# Patient Record
Sex: Female | Born: 1990 | Hispanic: No | Smoking: Never smoker
Health system: Southern US, Community
[De-identification: ages and names within clinical notes are randomized; demographics above are authoritative.]

## PROBLEM LIST (undated history)

## (undated) DIAGNOSIS — R87619 Unspecified abnormal cytological findings in specimens from cervix uteri: Secondary | ICD-10-CM

## (undated) HISTORY — PX: TENDON TRANSPLANT: SHX2488

## (undated) HISTORY — DX: Unspecified abnormal cytological findings in specimens from cervix uteri: R87.619

---

## 2018-11-22 ENCOUNTER — Emergency Department: Payer: Medicaid Other

## 2018-11-22 ENCOUNTER — Encounter: Payer: Self-pay | Admitting: *Deleted

## 2018-11-22 ENCOUNTER — Emergency Department
Admission: EM | Admit: 2018-11-22 | Discharge: 2018-11-22 | Disposition: A | Discharge status: Home / Self Care | Payer: Medicaid Other | Attending: Emergency Medicine | Admitting: Emergency Medicine

## 2018-11-22 ENCOUNTER — Other Ambulatory Visit: Payer: Self-pay

## 2018-11-22 DIAGNOSIS — Z5321 Procedure and treatment not carried out due to patient leaving prior to being seen by health care provider: Secondary | ICD-10-CM | POA: Insufficient documentation

## 2018-11-22 DIAGNOSIS — R2241 Localized swelling, mass and lump, right lower limb: Secondary | ICD-10-CM | POA: Insufficient documentation

## 2018-11-22 NOTE — ED Triage Notes (Signed)
Pt to triage via wheelchair. Pt injured right ankle while playing basketball.  Swelling to ankle noted   Denies other injury   Pt alert

## 2018-11-22 NOTE — ED Notes (Signed)
Pt was called to be roomed. Pt did not answer. RN called the Pt in the lobby and outside of the ED.  

## 2018-11-22 NOTE — ED Notes (Signed)
Pt was called to be roomed. Pt did not answer. RN called the Pt in the lobby and outside of the ED.

## 2018-12-02 ENCOUNTER — Other Ambulatory Visit: Payer: Self-pay

## 2018-12-02 ENCOUNTER — Emergency Department
Admission: EM | Admit: 2018-12-02 | Discharge: 2018-12-02 | Disposition: A | Discharge status: Home / Self Care | Payer: Medicaid Other | Attending: Emergency Medicine | Admitting: Emergency Medicine

## 2018-12-02 ENCOUNTER — Encounter: Payer: Self-pay | Admitting: Emergency Medicine

## 2018-12-02 DIAGNOSIS — S96822A Laceration of other specified muscles and tendons at ankle and foot level, left foot, initial encounter: Secondary | ICD-10-CM | POA: Insufficient documentation

## 2018-12-02 DIAGNOSIS — Y92000 Kitchen of unspecified non-institutional (private) residence as  the place of occurrence of the external cause: Secondary | ICD-10-CM | POA: Diagnosis not present

## 2018-12-02 DIAGNOSIS — S91312A Laceration without foreign body, left foot, initial encounter: Secondary | ICD-10-CM | POA: Diagnosis present

## 2018-12-02 DIAGNOSIS — Y9389 Activity, other specified: Secondary | ICD-10-CM | POA: Insufficient documentation

## 2018-12-02 DIAGNOSIS — W260XXA Contact with knife, initial encounter: Secondary | ICD-10-CM | POA: Insufficient documentation

## 2018-12-02 DIAGNOSIS — Y998 Other external cause status: Secondary | ICD-10-CM | POA: Diagnosis not present

## 2018-12-02 DIAGNOSIS — S96829A Laceration of other specified muscles and tendons at ankle and foot level, unspecified foot, initial encounter: Secondary | ICD-10-CM

## 2018-12-02 DIAGNOSIS — S91319A Laceration without foreign body, unspecified foot, initial encounter: Secondary | ICD-10-CM

## 2018-12-02 MED ORDER — LIDOCAINE-EPINEPHRINE 2 %-1:100000 IJ SOLN
20.0000 mL | Freq: Once | INTRAMUSCULAR | Status: AC
  Administered 2018-12-02: 1 mL via INTRADERMAL

## 2018-12-02 NOTE — ED Triage Notes (Addendum)
Pt presents to ED with laceration to top of left foot. Pt states she dropped a knife on foot approx 30 min ago. Small laceration with approximated edges noted. Bleeding currently controlled. Dressing applied in triage.

## 2018-12-02 NOTE — ED Provider Notes (Signed)
[facility] Emergency Department Provider Note   ____________________________________________    I have reviewed the triage vital signs and the nursing notes.   HISTORY  Chief Complaint Extremity Laceration     HPI Julie Todd is a 28 y.o. female presents with a laceration to her left foot.  Patient reports a chef's knife fell off the counter into the top of her left foot.  She reports she has difficulty extending her left big toe.  Tetanus is up-to-date  History reviewed. No pertinent past medical history.  There are no active problems to display for this patient.   History reviewed. No pertinent surgical history.  Prior to Admission medications   Not on File     Allergies Patient has no known allergies.  No family history on file.  Social History Social History   Tobacco Use  . Smoking status: Never Smoker  . Smokeless tobacco: Never Used  Substance Use Topics  . Alcohol use: Yes  . Drug use: Not Currently    Review of Systems     Musculoskeletal: Left foot pain Skin: Laceration as above Neurological: No numbness, weakness of left first toe extension    ____________________________________________   PHYSICAL EXAM:  VITAL SIGNS: ED Triage Vitals [12/02/18 0124]  Enc Vitals Group     BP (!) 146/126     Pulse Rate (!) 104     Resp 20     Temp 98.2 F (36.8 C)     Temp Source Oral     SpO2 98 %     Weight 81.6 kg (180 lb)     Height 1.651 m (5\' 5" )     Head Circumference      Peak Flow      Pain Score 6     Pain Loc      Pain Edu?      Excl. in GC?      Constitutional: Alert and oriented.    Musculoskeletal: Left foot: Approximately 2 cm linear laceration to the left foot on the medial aspect dorsally.  Patient appears unable to extend her first toe which raises suspicion for tendon laceration, tendon is not visible on exam.  No foreign bodies Neurologic: No numbness Skin:  Skin is warm,  dry   ____________________________________________   LABS (all labs ordered are listed, but only abnormal results are displayed)  Labs Reviewed - No data to display ____________________________________________  EKG   ____________________________________________  RADIOLOGY   ____________________________________________   PROCEDURES  Procedure(s) performed: yes  .Marland KitchenLaceration Repair Date/Time: 12/02/2018 2:36 AM Performed by: Jene Every, MD Authorized by: Jene Every, MD   Consent:    Consent obtained:  Verbal   Consent given by:  Patient   Risks discussed:  Infection, pain, retained foreign body, poor cosmetic result and poor wound healing Anesthesia (see MAR for exact dosages):    Anesthesia method:  Local infiltration   Local anesthetic:  Lidocaine 1% WITH epi Laceration details:    Location:  Foot   Foot location:  Top of L foot   Length (cm):  2 Repair type:    Repair type:  Simple Pre-procedure details:    Preparation:  Patient was prepped and draped in usual sterile fashion Exploration:    Hemostasis achieved with:  Direct pressure   Wound exploration: entire depth of wound probed and visualized     Wound extent: tendon damage     Tendon repair plan:  Refer for evaluation   Contaminated: no  Treatment:    Area cleansed with:  Betadine   Amount of cleaning:  Standard   Irrigation solution:  Sterile saline   Visualized foreign bodies/material removed: no   Skin repair:    Repair method:  Sutures   Suture size:  4-0   Suture material:  Nylon   Suture technique:  Simple interrupted Approximation:    Approximation:  Close Post-procedure details:    Dressing:  Adhesive bandage   Patient tolerance of procedure:  Tolerated well, no immediate complications     Critical Care performed: No ____________________________________________   INITIAL IMPRESSION / ASSESSMENT AND PLAN / ED COURSE  Pertinent labs & imaging results that were available  during my care of the patient were reviewed by me and considered in my medical decision making (see chart for details).  Patient with laceration to the left foot, suspicious for tendon laceration she appears to have difficulty extending her great toe although this may be related to pain as well.  Wound sutured, will defer to podiatry for further evaluation of possible tendon injury.   ____________________________________________   FINAL CLINICAL IMPRESSION(S) / ED DIAGNOSES  Final diagnoses:  Laceration of foot involving extensor tendon      NEW MEDICATIONS STARTED DURING THIS VISIT:  New Prescriptions   No medications on file     Note:  This document was prepared using Dragon voice recognition software and may include unintentional dictation errors.   Jene Every, MD 12/02/18 [id number]

## 2018-12-06 ENCOUNTER — Other Ambulatory Visit: Payer: Self-pay | Admitting: Podiatry

## 2018-12-06 ENCOUNTER — Ambulatory Visit: Payer: Medicaid Other | Admitting: Podiatry

## 2018-12-06 ENCOUNTER — Encounter: Payer: Self-pay | Admitting: Podiatry

## 2018-12-06 ENCOUNTER — Ambulatory Visit (INDEPENDENT_AMBULATORY_CARE_PROVIDER_SITE_OTHER): Payer: Medicaid Other

## 2018-12-06 DIAGNOSIS — S91312A Laceration without foreign body, left foot, initial encounter: Secondary | ICD-10-CM

## 2018-12-06 DIAGNOSIS — S96922A Laceration of unspecified muscle and tendon at ankle and foot level, left foot, initial encounter: Secondary | ICD-10-CM | POA: Diagnosis not present

## 2018-12-06 DIAGNOSIS — S99922A Unspecified injury of left foot, initial encounter: Secondary | ICD-10-CM

## 2018-12-06 NOTE — Patient Instructions (Signed)
Pre-Operative Instructions  Congratulations, you have decided to take an important step towards improving your quality of life.  You can be assured that the doctors and staff at [facility] will be with you every step of the way.  Here are some important things you should know:  1. Plan to be at the surgery center/hospital at least 1 (one) hour prior to your scheduled time, unless otherwise directed by the surgical center/hospital staff.  You must have a responsible adult accompany you, remain during the surgery and drive you home.  Make sure you have directions to the surgical center/hospital to ensure you arrive on time. 2. If you are having surgery at [facility] or [facility] hospitals, you will need a copy of your medical history and physical form from your family physician within one month prior to the date of surgery. We will give you a form for your primary physician to complete.  3. We make every effort to accommodate the date you request for surgery.  However, there are times where surgery dates or times have to be moved.  We will contact you as soon as possible if a change in schedule is required.   4. No aspirin/ibuprofen for one week before surgery.  If you are on aspirin, any non-steroidal anti-inflammatory medications (Mobic, Aleve, Ibuprofen) should not be taken seven (7) days prior to your surgery.  You make take Tylenol for pain prior to surgery.  5. Medications - If you are taking daily heart and blood pressure medications, seizure, reflux, allergy, asthma, anxiety, pain or diabetes medications, make sure you notify the surgery center/hospital before the day of surgery so they can tell you which medications you should take or avoid the day of surgery. 6. No food or drink after midnight the night before surgery unless directed otherwise by surgical center/hospital staff. 7. No alcoholic beverages 24-hours prior to surgery.  No smoking 24-hours prior or 24-hours after  surgery. 8. Wear loose pants or shorts. They should be loose enough to fit over bandages, boots, and casts. 9. Don't wear slip-on shoes. Sneakers are preferred. 10. Bring your boot with you to the surgery center/hospital.  Also bring crutches or a walker if your physician has prescribed it for you.  If you do not have this equipment, it will be provided for you after surgery. 11. If you have not been contacted by the surgery center/hospital by the day before your surgery, call to confirm the date and time of your surgery. 12. Leave-time from work may vary depending on the type of surgery you have.  Appropriate arrangements should be made prior to surgery with your employer. 13. Prescriptions will be provided immediately following surgery by your doctor.  Fill these as soon as possible after surgery and take the medication as directed. Pain medications will not be refilled on weekends and must be approved by the doctor. 14. Remove nail polish on the operative foot and avoid getting pedicures prior to surgery. 15. Wash the night before surgery.  The night before surgery wash the foot and leg well with water and the antibacterial soap provided. Be sure to pay special attention to beneath the toenails and in between the toes.  Wash for at least three (3) minutes. Rinse thoroughly with water and dry well with a towel.  Perform this wash unless told not to do so by your physician.  Enclosed: 1 Ice pack (please put in freezer the night before surgery)   1 Hibiclens skin cleaner     Pre-op instructions  If you have any questions regarding the instructions, please do not hesitate to call our office.  [place]: [address], [place], [place] [address] -- [phone]  [place]: [address] [place], [place] [address] -- [phone]  [place]: [address]  [place], [place] [address] -- [phone]  [place]: [address], [address], [place], [place] [address] -- [phone]  Website:  [email or web address] 

## 2018-12-08 ENCOUNTER — Telehealth: Payer: Self-pay | Admitting: *Deleted

## 2018-12-08 NOTE — Telephone Encounter (Signed)
"  I'm calling to set up an appointment.  Call me at your convenience."

## 2018-12-20 NOTE — Progress Notes (Signed)
   HPI: 28 year old female presents the office today for evaluation of a laceration to the left dorsal foot.  She says that approximately 1 week ago she dropped a knife on top of her foot.  She was seen at [facility] hospital and stitches were performed the superficial skin.  She does not have much pain however she cannot dorsiflex her left hallux.  She has been taking Aleve as needed pain and swelling.  No past medical history on file.   Physical Exam: General: The patient is alert and oriented x3 in no acute distress.  Dermatology: Skin is warm, dry and supple bilateral lower extremities. Negative for open lesions or macerations.  Vascular: Palpable pedal pulses bilaterally. No edema or erythema noted. Capillary refill within normal limits.  Neurological: Epicritic and protective threshold grossly intact bilaterally.   Musculoskeletal Exam: Loss of dorsiflexion noted to the left hallux consistent with laceration of the EHL tendon range of motion within normal limits to all pedal and ankle joints bilateral. Muscle strength 5/5 in all groups bilateral.   Radiographic Exam:  Normal osseous mineralization. Joint spaces preserved. No fracture/dislocation/boney destruction.    Assessment: 1.  Lacerated EHL tendon left   Plan of Care:  1. Patient evaluated. X-Rays reviewed.  2.  Today explained the patient that she needs to have surgical repair of the extensor tendon left in order to restore her function.  All possible complications and details the procedure were explained.  No guarantees were expressed or implied. 3.  Authorization for surgery initiated today.  Surgery will consist of primary repair extensor hallucis longus tendon left 4.  Prior to surgery I would like to have an MRI to better visualize the opposing ends of the lacerated tendon.  This would help in surgical planning to determine how far retracted the tendons are 5.  The patient is currently wearing an immobilization cam boot and  using crutches.  Continue as needed 6.  Return to clinic 1 week postop  *Girlfriend is Revonda Standard.  Her 63-year-old boy is IT consultant.  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 Felecia Shelling, DPM    [address] [place], [place] [address]                Office [phone]  Fax [phone]

## 2019-01-03 NOTE — Telephone Encounter (Signed)
I attempted to return her call.  I left her messages to give me a call back. 

## 2019-02-09 NOTE — Telephone Encounter (Signed)
I am calling you in regards to setting up a surgery date.  Would you like to schedule a date?  "Yes, I would."  Dr. Logan Bores does surgeries on Thursdays.  Do you have a date you would like to do it?  "Any date is fine."  He can do it on Feb 23, 2019.  "That date will be fine."  I'll get it scheduled.  Someone from the surgical center will give you a call a day or two prior to your surgery date and they will give you your arrival time.  "Okay, thank you."  I scheduled the surgery via the surgical center's [employer] Portal.

## 2019-02-10 NOTE — Telephone Encounter (Signed)
I spoke with patient and informed her that I will get authorization with Medicaid for MRI again and schedule her MRI appt.  I informed her that I will call her when this is done.  She verbalized understanding

## 2019-02-10 NOTE — Telephone Encounter (Signed)
Julie Todd and Julie Todd, this patient was seen in February for a tendon laceration. I ordered an MRI. It would be nice to have it done prior to surgery so I can visualize how far the tendon has retracted. Could someone please follow up with that? Thanks

## 2019-02-10 NOTE — Telephone Encounter (Signed)
Unable to leave a message mailbox is full. 

## 2019-02-13 ENCOUNTER — Other Ambulatory Visit: Payer: Self-pay

## 2019-02-13 DIAGNOSIS — S96922A Laceration of unspecified muscle and tendon at ankle and foot level, left foot, initial encounter: Principal | ICD-10-CM

## 2019-02-13 DIAGNOSIS — S91312A Laceration without foreign body, left foot, initial encounter: Secondary | ICD-10-CM

## 2019-02-13 NOTE — Progress Notes (Signed)
MRI has been re-approved from 12/29/2018 to 06/27/2019 Auth# [id number]

## 2019-02-14 ENCOUNTER — Telehealth: Payer: Self-pay | Admitting: *Deleted

## 2019-02-14 ENCOUNTER — Ambulatory Visit
Admission: RE | Admit: 2019-02-14 | Discharge: 2019-02-14 | Disposition: A | Discharge status: Home / Self Care | Payer: Medicaid Other | Source: Ambulatory Visit | Attending: Podiatry | Admitting: Podiatry

## 2019-02-14 ENCOUNTER — Other Ambulatory Visit: Payer: Self-pay

## 2019-02-14 DIAGNOSIS — S91312A Laceration without foreign body, left foot, initial encounter: Secondary | ICD-10-CM | POA: Diagnosis not present

## 2019-02-14 DIAGNOSIS — S96922A Laceration of unspecified muscle and tendon at ankle and foot level, left foot, initial encounter: Secondary | ICD-10-CM | POA: Insufficient documentation

## 2019-02-14 NOTE — Telephone Encounter (Signed)
[facility] states pt needs orders put in for the MRI scheduled today.

## 2019-02-23 ENCOUNTER — Other Ambulatory Visit: Payer: Self-pay | Admitting: Podiatry

## 2019-02-23 DIAGNOSIS — S96922D Laceration of unspecified muscle and tendon at ankle and foot level, left foot, subsequent encounter: Secondary | ICD-10-CM

## 2019-02-23 DIAGNOSIS — S96112A Strain of muscle and tendon of long extensor muscle of toe at ankle and foot level, left foot, initial encounter: Secondary | ICD-10-CM | POA: Diagnosis not present

## 2019-02-23 MED ORDER — OXYCODONE-ACETAMINOPHEN 5-325 MG PO TABS: 1.0000 | ORAL_TABLET | Freq: Four times a day (QID) | ORAL | 0 refills | Status: DC | PRN

## 2019-02-23 NOTE — Progress Notes (Signed)
.  postop

## 2019-02-27 ENCOUNTER — Encounter: Payer: Self-pay | Admitting: Podiatry

## 2019-02-27 ENCOUNTER — Telehealth: Payer: Self-pay

## 2019-02-27 NOTE — Telephone Encounter (Signed)
Called pt post surgery; "doing good, only pain is at night when foot is elevated in bed; taking half a percocet every couple of hours seem to help; no other concerns"; informed pt the importance of elevation and staying off foot as much as possible; informed pt of next post op visit 03/03/2019 but to call if any questions or concerns if any before then

## 2019-03-03 ENCOUNTER — Other Ambulatory Visit: Payer: Self-pay

## 2019-03-03 ENCOUNTER — Ambulatory Visit (INDEPENDENT_AMBULATORY_CARE_PROVIDER_SITE_OTHER): Payer: Self-pay | Admitting: Podiatry

## 2019-03-03 VITALS — Temp 97.4°F

## 2019-03-03 DIAGNOSIS — S96922D Laceration of unspecified muscle and tendon at ankle and foot level, left foot, subsequent encounter: Secondary | ICD-10-CM

## 2019-03-03 DIAGNOSIS — S96822D Laceration of other specified muscles and tendons at ankle and foot level, left foot, subsequent encounter: Secondary | ICD-10-CM

## 2019-03-03 DIAGNOSIS — Z9889 Other specified postprocedural states: Secondary | ICD-10-CM

## 2019-03-06 NOTE — Progress Notes (Signed)
   Subjective:  Patient presents today status post EHL tendon repair left. DOS: 02/23/2019. She states she is doing well overall. She reports some pain but states it is tolerable with the pain medications. There are no modifying factors noted. She denies nausea, vomiting, fever, chills, SOB or CP. Patient is here for further evaluation and treatment.    No past medical history on file.    Objective/Physical Exam Neurovascular status intact.  Skin incisions appear to be well coapted with sutures and staples intact. No sign of infectious process noted. No dehiscence. No active bleeding noted. Moderate edema noted to the surgical extremity.  Assessment: 1. s/p EHL tendon repair left. DOS: 02/23/2019   Plan of Care:  1. Patient was evaluated. 2. Dressing changed. Keep clean, dry and intact for one week.  3. Continue nonweightbearing in CAM boot with crutches.  4. Return to clinic in one week for suture removal.    Felecia Shelling, DPM [facility]  Dr. Felecia Shelling, DPM    [address]                                        [place], [place] [address]                Office [phone]  Fax [phone]

## 2019-03-10 ENCOUNTER — Other Ambulatory Visit: Payer: Self-pay

## 2019-03-10 ENCOUNTER — Ambulatory Visit (INDEPENDENT_AMBULATORY_CARE_PROVIDER_SITE_OTHER): Payer: Self-pay | Admitting: Podiatry

## 2019-03-10 VITALS — Temp 98.5°F

## 2019-03-10 DIAGNOSIS — S96922D Laceration of unspecified muscle and tendon at ankle and foot level, left foot, subsequent encounter: Secondary | ICD-10-CM

## 2019-03-10 DIAGNOSIS — Z9889 Other specified postprocedural states: Secondary | ICD-10-CM

## 2019-03-10 DIAGNOSIS — S96822D Laceration of other specified muscles and tendons at ankle and foot level, left foot, subsequent encounter: Secondary | ICD-10-CM

## 2019-03-14 NOTE — Progress Notes (Signed)
   Subjective:  Patient presents today status post EHL tendon repair left. DOS: 02/23/2019. She reports some intermittent pain but states it is improving. There are no modifying factors noted. She has been using the CAM boot as directed. Patient is here for further evaluation and treatment.    No past medical history on file.    Objective/Physical Exam Neurovascular status intact.  Skin incisions appear to be well coapted with sutures and staples intact. No sign of infectious process noted. No dehiscence. No active bleeding noted. Moderate edema noted to the surgical extremity.  Assessment: 1. s/p EHL tendon repair left. DOS: 02/23/2019   Plan of Care:  1. Patient was evaluated. 2. Sutures removed.  3. Continue nonweightbearing in CAM boot.  4. Return to clinic in 2 weeks to begin weightbearing.    Felecia Shelling, DPM [facility]  Dr. Felecia Shelling, DPM    [address]                                        [place], [place] [address]                Office [phone]  Fax [phone]

## 2019-03-24 ENCOUNTER — Encounter: Payer: Self-pay | Admitting: Podiatry

## 2019-03-24 ENCOUNTER — Other Ambulatory Visit: Payer: Self-pay

## 2019-03-24 ENCOUNTER — Ambulatory Visit (INDEPENDENT_AMBULATORY_CARE_PROVIDER_SITE_OTHER): Payer: Medicaid Other | Admitting: Podiatry

## 2019-03-24 VITALS — Temp 98.0°F

## 2019-03-24 DIAGNOSIS — Z9889 Other specified postprocedural states: Secondary | ICD-10-CM

## 2019-03-24 DIAGNOSIS — S96922D Laceration of unspecified muscle and tendon at ankle and foot level, left foot, subsequent encounter: Secondary | ICD-10-CM

## 2019-03-24 DIAGNOSIS — S96822D Laceration of other specified muscles and tendons at ankle and foot level, left foot, subsequent encounter: Secondary | ICD-10-CM

## 2019-03-27 NOTE — Progress Notes (Signed)
   Subjective:  Patient presents today status post EHL tendon repair left. DOS: 02/23/2019. She states she is doing well. She reports some intermittent burning that usually only occurs at night. She denies any modifying factors. She has been using the CAM boot as directed. Patient is here for further evaluation and treatment.    No past medical history on file.    Objective/Physical Exam Neurovascular status intact.  Skin incisions appear to be well coapted. No sign of infectious process noted. No dehiscence. No active bleeding noted. Moderate edema noted to the surgical extremity.  Assessment: 1. s/p EHL tendon repair left. DOS: 02/23/2019   Plan of Care:  1. Patient was evaluated. 2. Discontinue using CAM boot.  3. Recommended good shoe gear.  4. Return to work on 04/10/2019.    Edrick Kins, DPM [facility]  Dr. Edrick Kins, [address]                                        [place], [place] [address]                Office [phone]  Fax [phone]

## 2019-04-25 ENCOUNTER — Ambulatory Visit (INDEPENDENT_AMBULATORY_CARE_PROVIDER_SITE_OTHER): Payer: Medicaid Other | Admitting: Podiatry

## 2019-04-25 ENCOUNTER — Other Ambulatory Visit: Payer: Self-pay

## 2019-04-25 ENCOUNTER — Encounter: Payer: Self-pay | Admitting: Podiatry

## 2019-04-25 VITALS — Temp 97.5°F

## 2019-04-25 DIAGNOSIS — Z9889 Other specified postprocedural states: Secondary | ICD-10-CM

## 2019-04-25 DIAGNOSIS — S96922D Laceration of unspecified muscle and tendon at ankle and foot level, left foot, subsequent encounter: Secondary | ICD-10-CM

## 2019-04-25 DIAGNOSIS — S96822D Laceration of other specified muscles and tendons at ankle and foot level, left foot, subsequent encounter: Secondary | ICD-10-CM

## 2019-04-27 NOTE — Progress Notes (Signed)
   Subjective:  Patient presents today status post EHL tendon repair left. DOS: 02/23/2019. She states she is doing well. She denies any pain or modifying factors. She denies any new complaints at this time. Patient is here for further evaluation and treatment.    History reviewed. No pertinent past medical history.    Objective/Physical Exam Neurovascular status intact.  Skin incisions appear to be well coapted. No sign of infectious process noted. No dehiscence. No active bleeding noted. Moderate edema noted to the surgical extremity.  Assessment: 1. s/p EHL tendon repair left. DOS: 02/23/2019   Plan of Care:  1. Patient was evaluated. 2. May resume full activity with no restrictions.  3. Recommended good shoe gear.  4. Return to clinic as needed.     Edrick Kins, DPM [facility]  Dr. Edrick Kins, [address]                                        [place], [place] [address]                Office [phone]  Fax [phone]

## 2020-04-18 DIAGNOSIS — Z419 Encounter for procedure for purposes other than remedying health state, unspecified: Secondary | ICD-10-CM | POA: Diagnosis not present

## 2020-05-19 DIAGNOSIS — Z419 Encounter for procedure for purposes other than remedying health state, unspecified: Secondary | ICD-10-CM | POA: Diagnosis not present

## 2020-05-29 IMAGING — CR DG ANKLE COMPLETE 3+V*R*
1 series · 3 of 3 positions shown · non-contrast
Comparison: None.

CLINICAL DATA: Right ankle injury while playing basketball.
Swelling.

EXAM:
RIGHT ANKLE - COMPLETE 3+ VIEW

[Series 1: dg ankle complete right · 0.14mm/px · 3 of 3 slices shown]
[im 1/3]
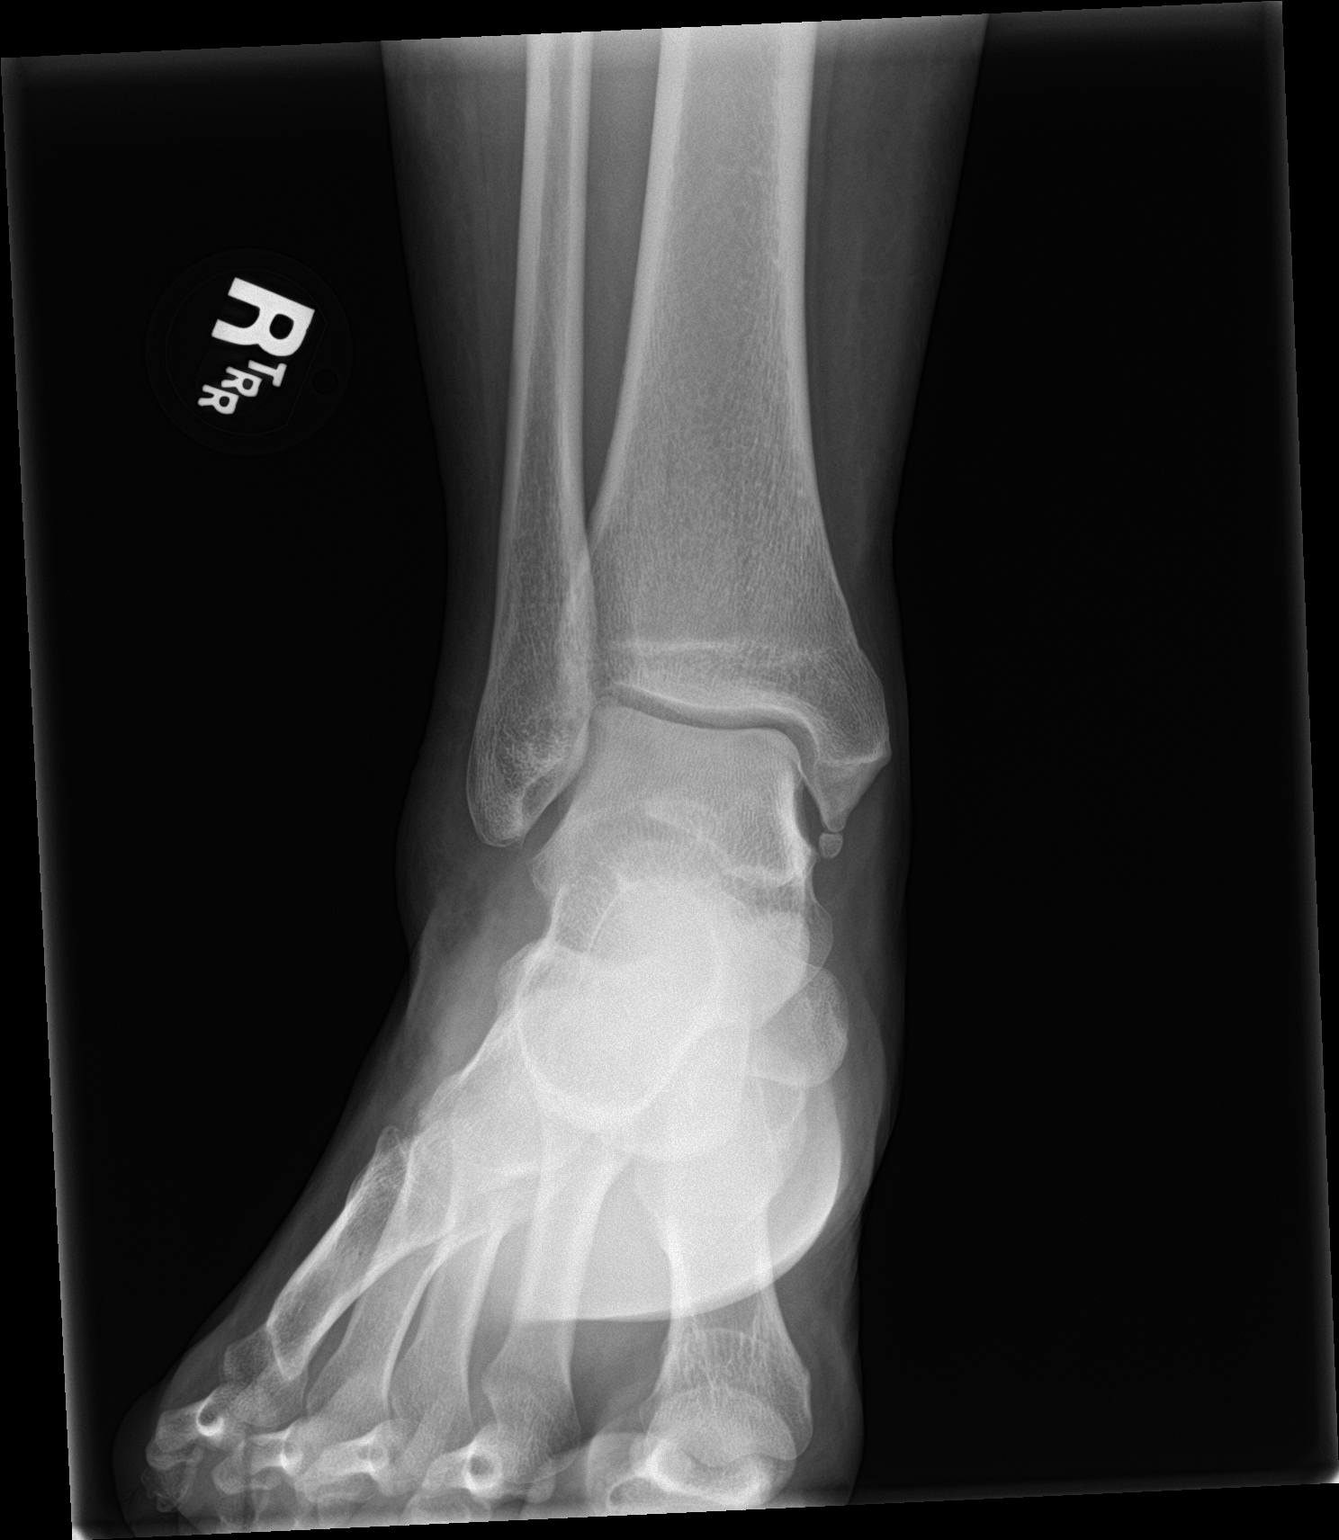
[im 2/3]
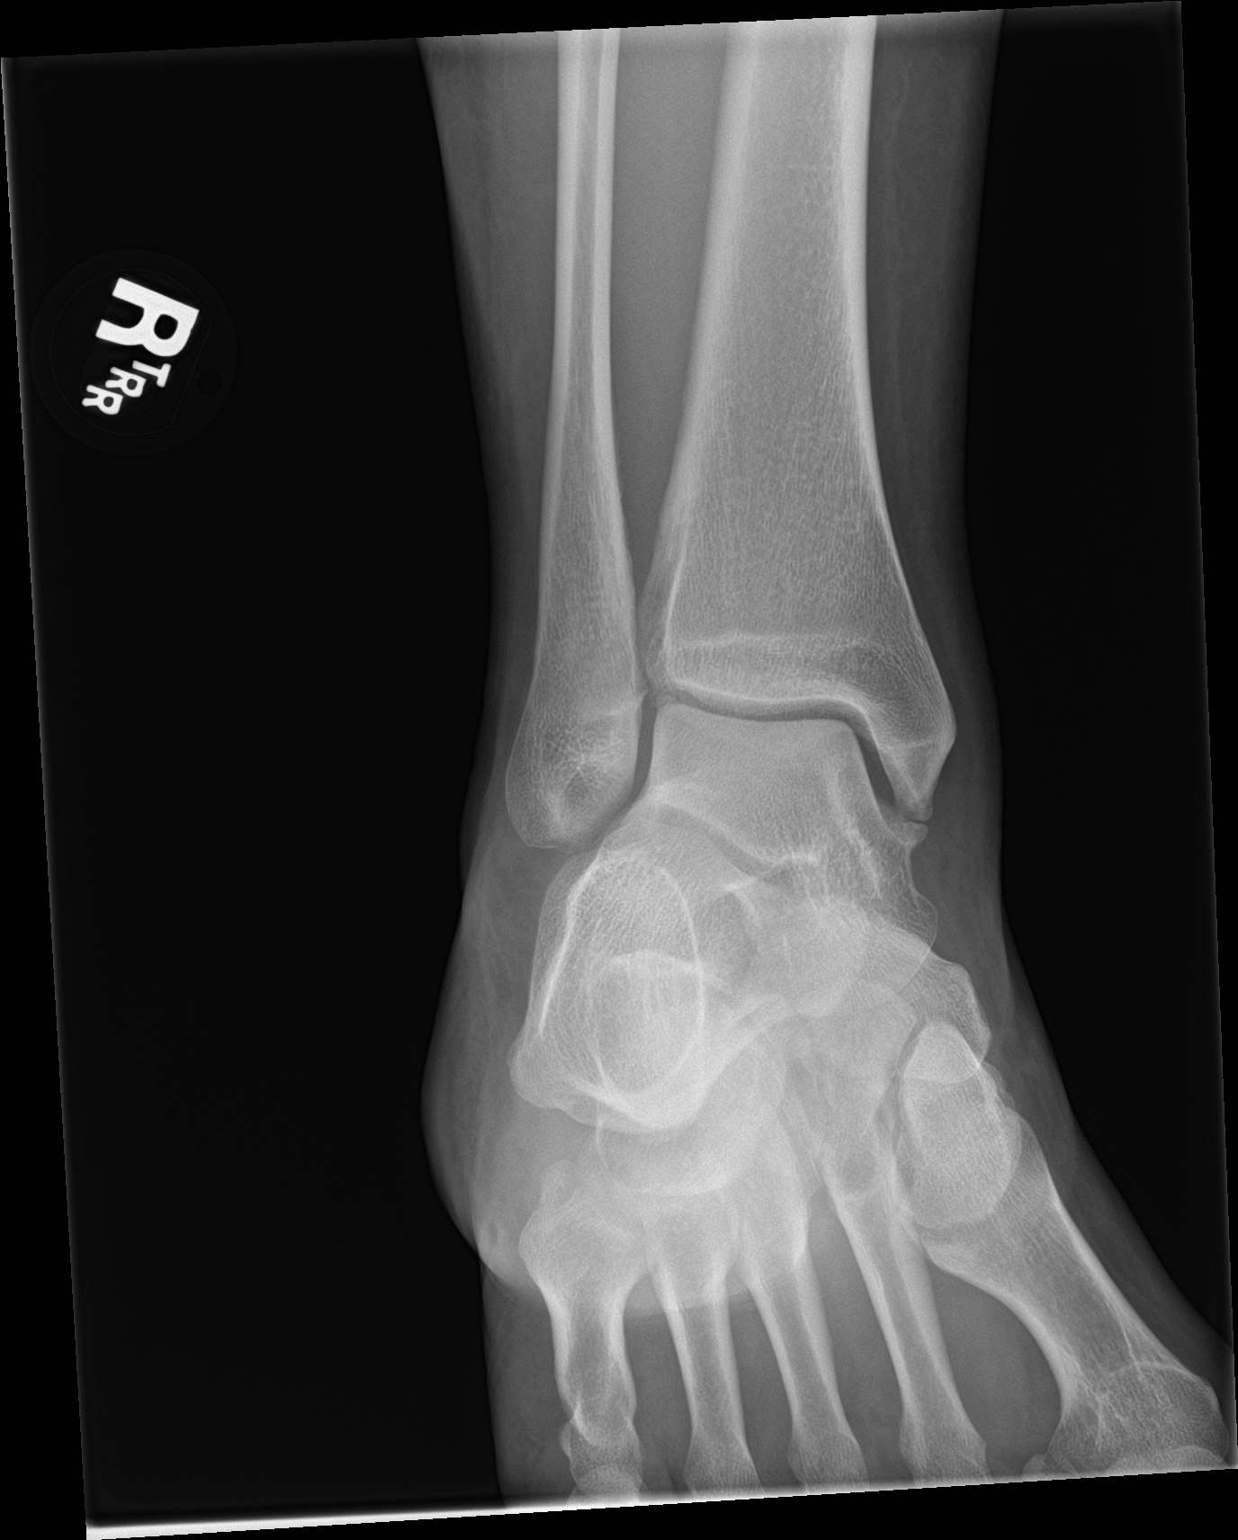
[im 3/3]
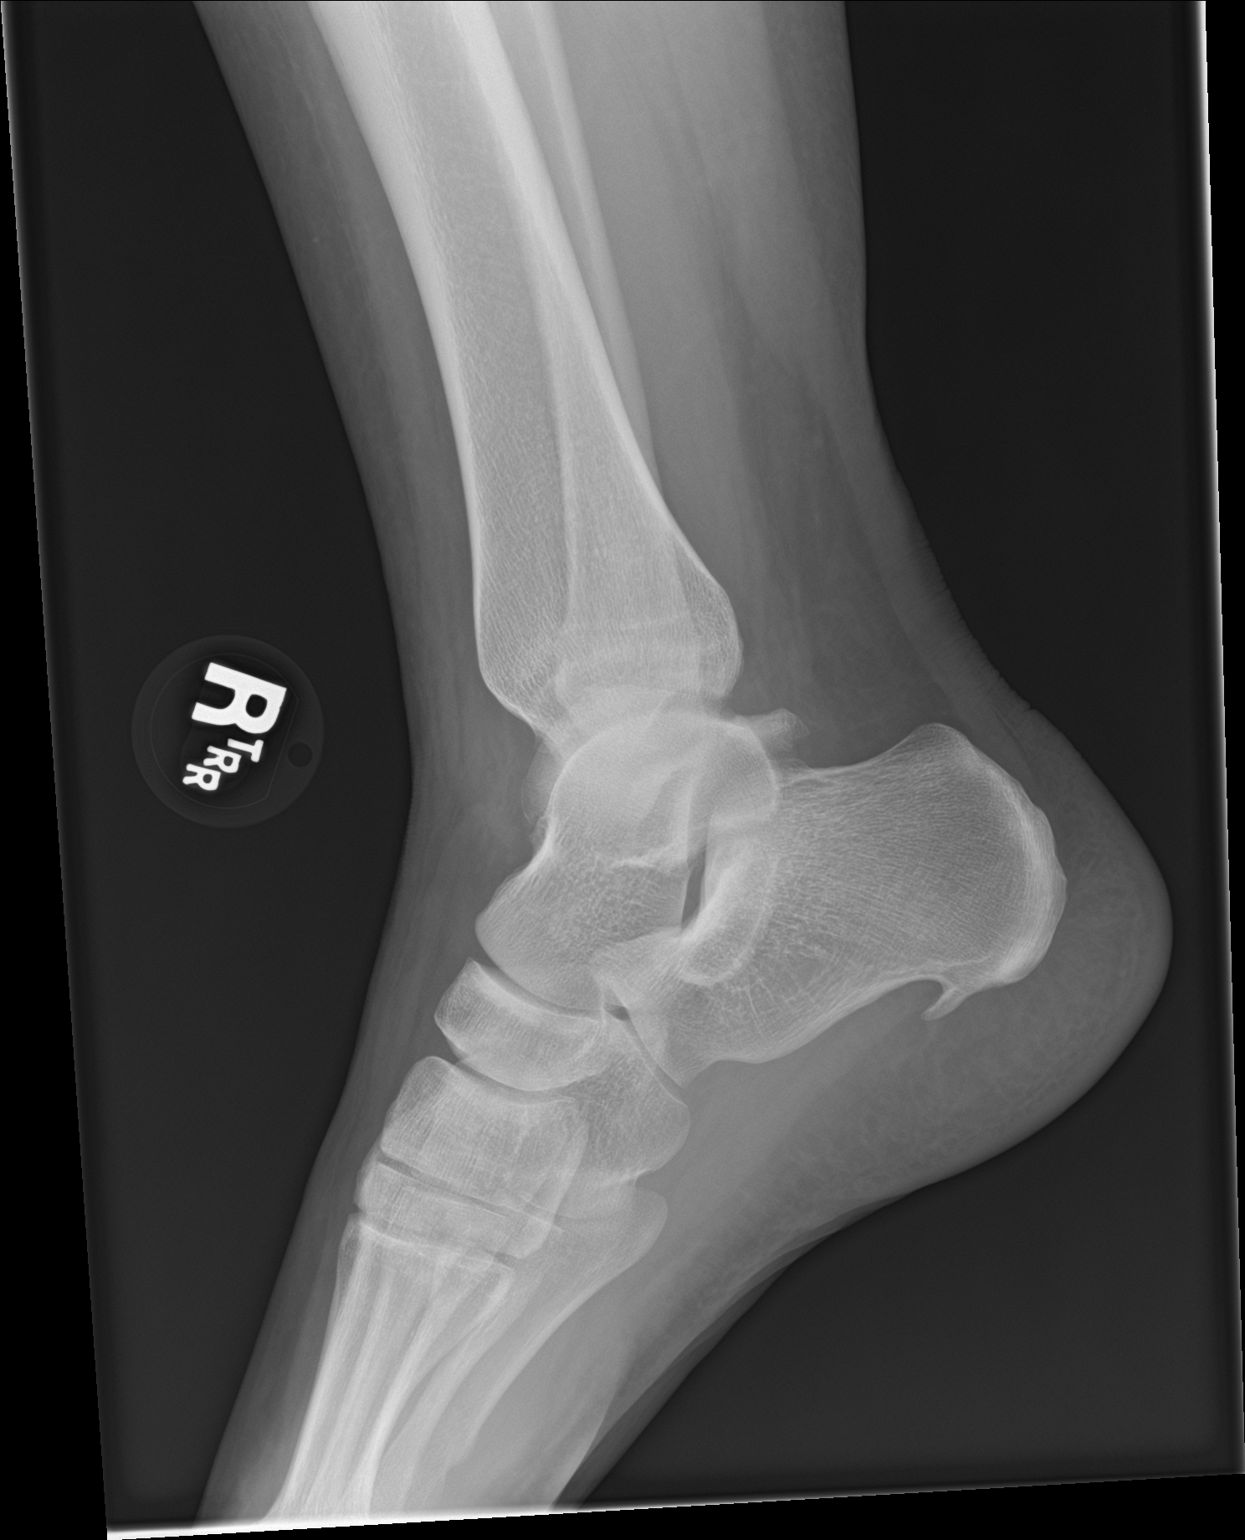

[3 of 3 positions shown; findings below may reference images not displayed]

FINDINGS: Lateral soft tissue swelling about the right ankle. Old appearing
ununited ossicle inferior to the medial malleolus. No evidence of
acute fracture or dislocation. No focal bone lesion or bone
destruction. Prominent posterior process of the talus. Plantar
calcaneal spur.
IMPRESSION: Lateral soft tissue swelling. No acute bony abnormalities.

## 2020-06-19 DIAGNOSIS — Z419 Encounter for procedure for purposes other than remedying health state, unspecified: Secondary | ICD-10-CM | POA: Diagnosis not present

## 2020-07-19 DIAGNOSIS — Z419 Encounter for procedure for purposes other than remedying health state, unspecified: Secondary | ICD-10-CM | POA: Diagnosis not present

## 2020-08-19 DIAGNOSIS — Z419 Encounter for procedure for purposes other than remedying health state, unspecified: Secondary | ICD-10-CM | POA: Diagnosis not present

## 2020-09-18 DIAGNOSIS — Z419 Encounter for procedure for purposes other than remedying health state, unspecified: Secondary | ICD-10-CM | POA: Diagnosis not present

## 2020-10-19 DIAGNOSIS — Z419 Encounter for procedure for purposes other than remedying health state, unspecified: Secondary | ICD-10-CM | POA: Diagnosis not present

## 2020-11-19 DIAGNOSIS — Z419 Encounter for procedure for purposes other than remedying health state, unspecified: Secondary | ICD-10-CM | POA: Diagnosis not present

## 2020-12-17 DIAGNOSIS — Z419 Encounter for procedure for purposes other than remedying health state, unspecified: Secondary | ICD-10-CM | POA: Diagnosis not present

## 2021-01-17 DIAGNOSIS — Z419 Encounter for procedure for purposes other than remedying health state, unspecified: Secondary | ICD-10-CM | POA: Diagnosis not present

## 2021-01-20 ENCOUNTER — Encounter: Payer: Self-pay | Admitting: Nurse Practitioner

## 2021-01-20 ENCOUNTER — Ambulatory Visit (INDEPENDENT_AMBULATORY_CARE_PROVIDER_SITE_OTHER): Payer: Medicaid Other | Admitting: Nurse Practitioner

## 2021-01-20 ENCOUNTER — Other Ambulatory Visit: Payer: Self-pay

## 2021-01-20 VITALS — BP 130/80 | HR 97 | Temp 98.1°F | Ht 65.04 in | Wt 233.1 lb

## 2021-01-20 DIAGNOSIS — J45909 Unspecified asthma, uncomplicated: Secondary | ICD-10-CM | POA: Diagnosis not present

## 2021-01-20 DIAGNOSIS — Z7689 Persons encountering health services in other specified circumstances: Secondary | ICD-10-CM

## 2021-01-20 MED ORDER — ALBUTEROL SULFATE HFA 108 (90 BASE) MCG/ACT IN AERS: 2.0000 | INHALATION_SPRAY | Freq: Four times a day (QID) | RESPIRATORY_TRACT | 0 refills | Status: DC | PRN

## 2021-01-20 NOTE — Progress Notes (Signed)
BP (!) 140/93   Pulse (!) 102   Temp 98.4 F (36.9 C)   Ht 5' 5.04" (1.652 m)   Wt 233 lb 2 oz (105.7 kg)   SpO2 96%   BMI 38.75 kg/m    Subjective:    Patient ID: Julie Todd, female    DOB: Nov 02, 1990, 30 y.o.   MRN: [id number]  HPI: Aleaha Fickling is a 30 y.o. female presenting on 01/21/2021 for comprehensive medical examination. Current medical complaints include:none  Patient denies concerns at visit today.  Denies HA, CP, SOB, Dizziness, visual changes, swelling in lower extremities.   She currently lives with: Partner and 2 children Menopausal Symptoms: no  Depression Screen done today and results listed below:  Depression screen [facility] 2/9 01/20/2021  Decreased Interest 0  Down, Depressed, Hopeless 0  PHQ - 2 Score 0    The patient does not have a history of falls. I did complete a risk assessment for falls. A plan of care for falls was documented.   Past Medical History:  History reviewed. No pertinent past medical history.  Surgical History:  Past Surgical History:  Procedure Laterality Date  . TENDON TRANSPLANT      Medications:  Current Outpatient Medications on File Prior to Visit  Medication Sig  . albuterol (VENTOLIN HFA) 108 (90 Base) MCG/ACT inhaler Inhale 2 puffs into the lungs every 6 (six) hours as needed for wheezing or shortness of breath.   No current facility-administered medications on file prior to visit.    Allergies:  No Known Allergies  Social History:  Social History   Socioeconomic History  . Marital status: Unknown    Spouse name: Not on file  . Number of children: Not on file  . Years of education: Not on file  . Highest education level: Not on file  Occupational History  . Not on file  Tobacco Use  . Smoking status: Never Smoker  . Smokeless tobacco: Never Used  Vaping Use  . Vaping Use: Former  Substance and Sexual Activity  . Alcohol use: Yes    Alcohol/week: 4.0 standard drinks    Types: 4 Standard drinks or  equivalent per week  . Drug use: Not Currently  . Sexual activity: Yes    Birth control/protection: None  Other Topics Concern  . Not on file  Social History Narrative  . Not on file   Social Determinants of Health   Financial Resource Strain: Not on file  Food Insecurity: Not on file  Transportation Needs: Not on file  Physical Activity: Not on file  Stress: Not on file  Social Connections: Not on file  Intimate Partner Violence: Not on file   Social History   Tobacco Use  Smoking Status Never Smoker  Smokeless Tobacco Never Used   Social History   Substance and Sexual Activity  Alcohol Use Yes  . Alcohol/week: 4.0 standard drinks  . Types: 4 Standard drinks or equivalent per week    Family History:  Family History  Problem Relation Age of Onset  . Lupus Maternal Grandmother     Past medical history, surgical history, medications, allergies, family history and social history reviewed with patient today and changes made to appropriate areas of the chart.   Review of Systems  Eyes: Negative for blurred vision and double vision.  Respiratory: Negative for shortness of breath.   Cardiovascular: Negative for chest pain and leg swelling.  Neurological: Negative for dizziness and headaches.   All other ROS negative except  what is listed above and in the HPI.      Objective:    BP (!) 140/93   Pulse (!) 102   Temp 98.4 F (36.9 C)   Ht 5' 5.04" (1.652 m)   Wt 233 lb 2 oz (105.7 kg)   SpO2 96%   BMI 38.75 kg/m   Wt Readings from Last 3 Encounters:  01/21/21 233 lb 2 oz (105.7 kg)  01/20/21 233 lb 2 oz (105.7 kg)  12/02/18 180 lb (81.6 kg)    Physical Exam Vitals and nursing note reviewed. Exam conducted with a chaperone present.  Constitutional:      General: She is awake. She is not in acute distress.    Appearance: She is well-developed. She is not ill-appearing.  HENT:     Head: Normocephalic and atraumatic.     Right Ear: Hearing, tympanic  membrane, ear canal and external ear normal. No drainage.     Left Ear: Hearing, tympanic membrane, ear canal and external ear normal. No drainage.     Nose: Nose normal.     Right Sinus: No maxillary sinus tenderness or frontal sinus tenderness.     Left Sinus: No maxillary sinus tenderness or frontal sinus tenderness.     Mouth/Throat:     Mouth: Mucous membranes are moist.     Pharynx: Oropharynx is clear. Uvula midline. No pharyngeal swelling, oropharyngeal exudate or posterior oropharyngeal erythema.  Eyes:     General: Lids are normal.        Right eye: No discharge.        Left eye: No discharge.     Extraocular Movements: Extraocular movements intact.     Conjunctiva/sclera: Conjunctivae normal.     Pupils: Pupils are equal, round, and reactive to light.     Visual Fields: Right eye visual fields normal and left eye visual fields normal.  Neck:     Thyroid: No thyromegaly.     Vascular: No carotid bruit.     Trachea: Trachea normal.  Cardiovascular:     Rate and Rhythm: Normal rate and regular rhythm.     Heart sounds: Normal heart sounds. No murmur heard. No gallop.   Pulmonary:     Effort: Pulmonary effort is normal. No accessory muscle usage or respiratory distress.     Breath sounds: Normal breath sounds.  Chest:  Breasts:     Right: Normal. No axillary adenopathy or supraclavicular adenopathy.     Left: Normal. No axillary adenopathy or supraclavicular adenopathy.    Abdominal:     General: Bowel sounds are normal.     Palpations: Abdomen is soft. There is no hepatomegaly or splenomegaly.     Tenderness: There is no abdominal tenderness.  Genitourinary:    Labia:        Right: No rash, tenderness, lesion or injury.        Left: No rash, tenderness, lesion or injury.      Vagina: Normal.     Cervix: Discharge present.  Musculoskeletal:        General: Normal range of motion.     Cervical back: Normal range of motion and neck supple.     Right lower leg: No  edema.     Left lower leg: No edema.  Lymphadenopathy:     Head:     Right side of head: No submental, submandibular, tonsillar, preauricular or posterior auricular adenopathy.     Left side of head: No submental, submandibular, tonsillar, preauricular or posterior  auricular adenopathy.     Cervical: No cervical adenopathy.     Upper Body:     Right upper body: No supraclavicular, axillary or pectoral adenopathy.     Left upper body: No supraclavicular, axillary or pectoral adenopathy.     Lower Body: No right inguinal adenopathy. No left inguinal adenopathy.  Skin:    General: Skin is warm and dry.     Capillary Refill: Capillary refill takes less than 2 seconds.     Findings: No rash.  Neurological:     Mental Status: She is alert and oriented to person, place, and time.     Cranial Nerves: Cranial nerves are intact.     Gait: Gait is intact.     Deep Tendon Reflexes: Reflexes are normal and symmetric.     Reflex Scores:      Brachioradialis reflexes are 2+ on the right side and 2+ on the left side.      Patellar reflexes are 2+ on the right side and 2+ on the left side. Psychiatric:        Attention and Perception: Attention normal.        Mood and Affect: Mood normal.        Speech: Speech normal.        Behavior: Behavior normal. Behavior is cooperative.        Thought Content: Thought content normal.        Judgment: Judgment normal.     No results found for this or any previous visit.    Assessment & Plan:   Problem List Items Addressed This Visit      Cardiovascular and Mediastinum   Primary hypertension    Begin valsartan daily.  Side effects and benefits of medication discussed with patient during visit.  Discussed DASH diet and exercise.  Return to clinic in 1 month for reevaluation.       Relevant Medications   valsartan (DIOVAN) 40 MG tablet    Other Visit Diagnoses    Annual physical exam    -  Primary   Health maintenance reviewed with patient during  visit.  Labs ordered today.  Pap completed today.    Relevant Orders   CBC with Differential/Platelet   Comprehensive metabolic panel   Lipid panel   TSH   Urinalysis, Routine w reflex microscopic   Cytology - PAP   Screening for cervical cancer       Chaperoned by Verdon Cumminsiffany Reel, CMA.  Patient tolerated obtaining of specimen without complication.  PAP obtained in normal fashion.   Relevant Orders   Cytology - PAP       Follow up plan: Return in about 1 month (around 02/20/2021) for BP Check.   LABORATORY TESTING:  - Pap smear: pap done  IMMUNIZATIONS:   - Tdap: Tetanus vaccination status reviewed: last tetanus booster within 10 years. - Influenza: Postponed to flu season - Pneumovax: Not applicable - Prevnar: Not applicable - HPV: Up to date - Zostavax vaccine: Not applicable  SCREENING: -Mammogram: Not applicable  - Colonoscopy: Not applicable  - Bone Density: Not applicable  -Hearing Test: Not applicable  -Spirometry: Not applicable   PATIENT COUNSELING:   Advised to take 1 mg of folate supplement per day if capable of pregnancy.   Sexuality: Discussed sexually transmitted diseases, partner selection, use of condoms, avoidance of unintended pregnancy  and contraceptive alternatives.   Advised to avoid cigarette smoking.  I discussed with the patient that most people either abstain from alcohol  or drink within safe limits (<=14/week and <=4 drinks/occasion for males, <=7/weeks and <= 3 drinks/occasion for females) and that the risk for alcohol disorders and other health effects rises proportionally with the number of drinks per week and how often a drinker exceeds daily limits.  Discussed cessation/primary prevention of drug use and availability of treatment for abuse.   Diet: Encouraged to adjust caloric intake to maintain  or achieve ideal body weight, to reduce intake of dietary saturated fat and total fat, to limit sodium intake by avoiding high sodium foods and not  adding table salt, and to maintain adequate dietary potassium and calcium preferably from fresh fruits, vegetables, and low-fat dairy products.    stressed the importance of regular exercise  Injury prevention: Discussed safety belts, safety helmets, smoke detector, smoking near bedding or upholstery.   Dental health: Discussed importance of regular tooth brushing, flossing, and dental visits.    NEXT PREVENTATIVE PHYSICAL DUE IN 1 YEAR. Return in about 1 month (around 02/20/2021) for BP Check.
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
## 2021-01-21 ENCOUNTER — Ambulatory Visit (INDEPENDENT_AMBULATORY_CARE_PROVIDER_SITE_OTHER): Payer: Medicaid Other | Admitting: Nurse Practitioner

## 2021-01-21 ENCOUNTER — Other Ambulatory Visit (HOSPITAL_COMMUNITY)
Admission: RE | Admit: 2021-01-21 | Discharge: 2021-01-21 | Disposition: A | Discharge status: Home / Self Care | Payer: Medicaid Other | Source: Ambulatory Visit | Attending: Nurse Practitioner | Admitting: Nurse Practitioner

## 2021-01-21 ENCOUNTER — Encounter: Payer: Self-pay | Admitting: Nurse Practitioner

## 2021-01-21 VITALS — BP 140/93 | HR 102 | Temp 98.4°F | Ht 65.04 in | Wt 233.1 lb

## 2021-01-21 DIAGNOSIS — R030 Elevated blood-pressure reading, without diagnosis of hypertension: Secondary | ICD-10-CM

## 2021-01-21 DIAGNOSIS — Z124 Encounter for screening for malignant neoplasm of cervix: Secondary | ICD-10-CM | POA: Diagnosis not present

## 2021-01-21 DIAGNOSIS — R87612 Low grade squamous intraepithelial lesion on cytologic smear of cervix (LGSIL): Secondary | ICD-10-CM

## 2021-01-21 DIAGNOSIS — Z0000 Encounter for general adult medical examination without abnormal findings: Secondary | ICD-10-CM | POA: Diagnosis not present

## 2021-01-21 DIAGNOSIS — I10 Essential (primary) hypertension: Secondary | ICD-10-CM

## 2021-01-21 LAB — URINALYSIS, ROUTINE W REFLEX MICROSCOPIC
Bilirubin, UA: NEGATIVE
Glucose, UA: NEGATIVE
Ketones, UA: NEGATIVE
Leukocytes,UA: NEGATIVE
Nitrite, UA: NEGATIVE
Protein,UA: NEGATIVE
RBC, UA: NEGATIVE
Specific Gravity, UA: 1.02 (ref 1.005–1.030)
Urobilinogen, Ur: 0.2 mg/dL (ref 0.2–1.0)
pH, UA: 7.5 (ref 5.0–7.5)

## 2021-01-21 MED ORDER — VALSARTAN 40 MG PO TABS: 40.0000 mg | ORAL_TABLET | Freq: Every day | ORAL | 0 refills | Status: DC

## 2021-01-21 NOTE — Assessment & Plan Note (Signed)
Begin valsartan daily.  Side effects and benefits of medication discussed with patient during visit.  Discussed DASH diet and exercise.  Return to clinic in 1 month for reevaluation.

## 2021-01-22 LAB — COMPREHENSIVE METABOLIC PANEL
ALT: 15 IU/L (ref 0–32)
AST: 16 IU/L (ref 0–40)
Albumin/Globulin Ratio: 1.5 (ref 1.2–2.2)
Albumin: 4 g/dL (ref 3.8–4.8)
Alkaline Phosphatase: 88 IU/L (ref 44–121)
BUN/Creatinine Ratio: 11 (ref 9–23)
BUN: 7 mg/dL (ref 6–20)
Bilirubin Total: <0.2 mg/dL (ref 0.0–1.2)
CO2: 20 mmol/L (ref 20–29)
Calcium: 8.1 mg/dL — ABNORMAL LOW (ref 8.7–10.2)
Chloride: 106 mmol/L (ref 96–106)
Creatinine, Ser: 0.65 mg/dL (ref 0.57–1.00)
Globulin, Total: 2.7 g/dL (ref 1.5–4.5)
Glucose: 77 mg/dL (ref 65–99)
Potassium: 3.9 mmol/L (ref 3.5–5.2)
Sodium: 141 mmol/L (ref 134–144)
Total Protein: 6.7 g/dL (ref 6.0–8.5)
eGFR: 120 mL/min/{1.73_m2} (ref >59)

## 2021-01-22 LAB — CBC WITH DIFFERENTIAL/PLATELET
Basophils Absolute: 0 10*3/uL (ref 0.0–0.2)
Basos: 1 %
EOS (ABSOLUTE): 0.3 10*3/uL (ref 0.0–0.4)
Eos: 4 %
Hematocrit: 38.4 % (ref 34.0–46.6)
Hemoglobin: 12.6 g/dL (ref 11.1–15.9)
Immature Grans (Abs): 0 10*3/uL (ref 0.0–0.1)
Immature Granulocytes: 1 %
Lymphocytes Absolute: 2.2 10*3/uL (ref 0.7–3.1)
Lymphs: 29 %
MCH: 27.7 pg (ref 26.6–33.0)
MCHC: 32.8 g/dL (ref 31.5–35.7)
MCV: 84 fL (ref 79–97)
Monocytes Absolute: 0.5 10*3/uL (ref 0.1–0.9)
Monocytes: 7 %
Neutrophils Absolute: 4.6 10*3/uL (ref 1.4–7.0)
Neutrophils: 58 %
Platelets: 295 10*3/uL (ref 150–450)
RBC: 4.55 x10E6/uL (ref 3.77–5.28)
RDW: 13.9 % (ref 11.7–15.4)
WBC: 7.6 10*3/uL (ref 3.4–10.8)

## 2021-01-22 LAB — LIPID PANEL
Chol/HDL Ratio: 2.8 ratio (ref 0.0–4.4)
Cholesterol, Total: 127 mg/dL (ref 100–199)
HDL: 45 mg/dL (ref >39)
LDL Chol Calc (NIH): 65 mg/dL (ref 0–99)
Triglycerides: 86 mg/dL (ref 0–149)
VLDL Cholesterol Cal: 17 mg/dL (ref 5–40)

## 2021-01-22 LAB — TSH: TSH: 3 u[IU]/mL (ref 0.450–4.500)

## 2021-01-22 NOTE — Progress Notes (Signed)
Please let patient know that her lab work looks good.  No reasons for concern or medication changes.  Follow up as discussed.

## 2021-01-23 ENCOUNTER — Telehealth: Payer: Self-pay | Admitting: *Deleted

## 2021-01-23 NOTE — Telephone Encounter (Signed)
PT CAME IN AND BROUGHT STATE OF [place] DEPT OF TRANSPORTATION FORM IN TO BE FILLED OUT PLACED IN BIN FOR REVIEW

## 2021-01-24 LAB — CYTOLOGY - PAP: Adequacy: ABSENT

## 2021-01-24 NOTE — Addendum Note (Signed)
Addended by: Larae Grooms on: 01/24/2021 01:45 PM   Modules accepted: Orders

## 2021-01-24 NOTE — Progress Notes (Signed)
Please let patient know that her PAP was abnormal.  I recommend that she see a GYN for further evaluation and possible treatment.

## 2021-01-24 NOTE — Progress Notes (Signed)
Referral placed.

## 2021-02-10 ENCOUNTER — Other Ambulatory Visit (HOSPITAL_COMMUNITY)
Admission: RE | Admit: 2021-02-10 | Discharge: 2021-02-10 | Disposition: A | Discharge status: Home / Self Care | Payer: Medicaid Other | Source: Ambulatory Visit | Attending: Obstetrics and Gynecology | Admitting: Obstetrics and Gynecology

## 2021-02-10 ENCOUNTER — Other Ambulatory Visit: Payer: Self-pay

## 2021-02-10 ENCOUNTER — Ambulatory Visit (INDEPENDENT_AMBULATORY_CARE_PROVIDER_SITE_OTHER): Payer: Medicaid Other | Admitting: Obstetrics and Gynecology

## 2021-02-10 ENCOUNTER — Encounter: Payer: Self-pay | Admitting: Obstetrics and Gynecology

## 2021-02-10 VITALS — BP 118/74 | Ht 65.0 in | Wt 229.0 lb

## 2021-02-10 DIAGNOSIS — R87612 Low grade squamous intraepithelial lesion on cytologic smear of cervix (LGSIL): Secondary | ICD-10-CM | POA: Diagnosis not present

## 2021-02-10 DIAGNOSIS — N870 Mild cervical dysplasia: Secondary | ICD-10-CM | POA: Diagnosis not present

## 2021-02-10 NOTE — Progress Notes (Signed)
Referring Provider:  Larae Grooms, NP, from [facility]  HPI:  Julie Todd is a 30 y.o.  No obstetric history on file.  who presents today for evaluation and management of abnormal cervical cytology.    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 Abnormal Pap smear of cervix     Past Surgical History:  Procedure Laterality Date  . TENDON TRANSPLANT      SOCIAL HISTORY:  Social History   Substance and Sexual Activity  Alcohol Use Yes  . Alcohol/week: 4.0 standard drinks  . Types: 4 Standard drinks or equivalent per week   Substance and Sexual Activity  Alcohol Use Yes  . Alcohol/week: 4.0 standard drinks  . Types: 4 Standard drinks or equivalent per week     Substance and Sexual Activity  Drug Use Not Currently     Family History  Problem Relation Age of Onset  . Lupus Maternal Grandmother     ALLERGIES:  Patient has no known allergies.  Current Outpatient Medications on File Prior to Visit  Medication Sig Dispense Refill  . albuterol (VENTOLIN HFA) 108 (90 Base) MCG/ACT inhaler Inhale 2 puffs into the lungs every 6 (six) hours as needed for wheezing or shortness of breath. 8 g 0  . valsartan (DIOVAN) 40 MG tablet Take 1 tablet (40 mg total) by mouth daily. 30 tablet 0   No current facility-administered medications on file prior to visit.    Physical Exam: -Vitals:  BP 118/74   Ht 5\' 5"  (1.651 m)   Wt 229 lb (103.9 kg)   LMP 01/13/2021   BMI 38.11 kg/m  GEN: WD, WN, NAD.  A+ O x 3, good mood and affect. ABD:  NT, ND.  Soft, no masses.  No hernias noted.   Pelvic:   Vulva: Normal appearance.  No lesions.  Vagina: No lesions or abnormalities noted.  Support: Normal pelvic support.  Urethra No masses tenderness or scarring.  Meatus Normal size without lesions or prolapse.  Cervix: See below.  Anus: Normal exam.  No lesions.  Perineum: Normal exam.  No lesions.         Bimanual   Uterus: Normal size.  Non-tender.  Mobile.  AV.  Adnexae: No masses.  Non-tender to palpation.  Cul-de-sac: Negative for abnormality.   PROCEDURE: 1.  Urine Pregnancy Test:  not done. Reviewed with patient risks of performing ECC if pregnant. She states there is no way she could be pregnant 2.  Colposcopy performed with 4% acetic acid after verbal consent obtained                                         -Aceto-white Lesions Location(s): 12 and 6 o'clock (diffusely)              -Biopsy performed at 6 and 12 o'clock               -ECC indicated and performed: Yes.       -Biopsy sites made hemostatic with pressure, AgNO3, and/or Monsel's solution   -Satisfactory colposcopy: No.    -Evidence of Invasive cervical CA :  NO  ASSESSMENT:  Julie Todd is a 30 y.o. No obstetric history on file. here for  1. LGSIL on Pap smear of cervix   .  PLAN:  I discussed the  grading system of pap smears and HPV high risk viral types.  We will discuss and base management after colpo results return.     Thomasene Mohair, MD  [facility], [facility] 02/10/2021  10:08 AM   CC: Larae Grooms, NP [address] [place],  [place] [address]

## 2021-02-12 LAB — SURGICAL PATHOLOGY

## 2021-02-16 DIAGNOSIS — Z419 Encounter for procedure for purposes other than remedying health state, unspecified: Secondary | ICD-10-CM | POA: Diagnosis not present

## 2021-02-18 ENCOUNTER — Telehealth: Payer: Self-pay | Admitting: Obstetrics and Gynecology

## 2021-02-18 NOTE — Telephone Encounter (Signed)
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  Recommend follow up pap smear in 1 year. Discussed that this is very important to get so that we can prevent cervical cancer. She voiced understanding and agreement.

## 2021-02-20 ENCOUNTER — Ambulatory Visit (INDEPENDENT_AMBULATORY_CARE_PROVIDER_SITE_OTHER): Payer: Medicaid Other | Admitting: Nurse Practitioner

## 2021-02-20 ENCOUNTER — Encounter: Payer: Self-pay | Admitting: Nurse Practitioner

## 2021-02-20 ENCOUNTER — Other Ambulatory Visit: Payer: Self-pay

## 2021-02-20 VITALS — BP 116/73 | HR 81 | Temp 98.1°F | Wt 226.6 lb

## 2021-02-20 DIAGNOSIS — J45909 Unspecified asthma, uncomplicated: Secondary | ICD-10-CM | POA: Diagnosis not present

## 2021-02-20 DIAGNOSIS — I10 Essential (primary) hypertension: Secondary | ICD-10-CM

## 2021-02-20 MED ORDER — MONTELUKAST SODIUM 10 MG PO TABS: 10.0000 mg | ORAL_TABLET | Freq: Every day | ORAL | 1 refills | Status: DC

## 2021-02-20 MED ORDER — VALSARTAN 40 MG PO TABS: 40.0000 mg | ORAL_TABLET | Freq: Every day | ORAL | 1 refills | Status: DC

## 2021-02-20 NOTE — Assessment & Plan Note (Signed)
Chronic.  Controlled. Continue with current medication regimen. Refill sent to the pharmacy.  Return to clinic in 6 months for repeat BP check.

## 2021-02-20 NOTE — Assessment & Plan Note (Signed)
Chronic. Ongoing.  Begin Singulair daily to help with SOB.  If symptoms do not improve return to clinic for spirometry.  Return in 6 months for reevaluation.

## 2021-02-20 NOTE — Progress Notes (Signed)
BP 116/73   Pulse 81   Temp 98.1 F (36.7 C) (Oral)   Wt 226 lb 9.6 oz (102.8 kg)   LMP  (LMP Unknown)   SpO2 97%   BMI 37.71 kg/m    Subjective:    Patient ID: Julie Todd, female    DOB: 1991-09-22, 30 y.o.   MRN: [id number]  HPI: Julie Todd is a 30 y.o. female  Chief Complaint  Patient presents with  . Hypertension    1 month f/up   HYPERTENSION Hypertension status: stable  Satisfied with current treatment? yes Duration of hypertension: months BP monitoring frequency:  not checking BP range:  BP medication side effects:  no Medication compliance: excellent compliance Previous BP meds:valsartan Aspirin: no Recurrent headaches: no Visual changes: no Palpitations: no Dyspnea: SOB Chest pain: no Lower extremity edema: no Dizzy/lightheaded: no  ASTHMA Patient states she has had some SOB related to her asthma.  It has been worse since the pollen has been high.  She is using her inhaler 2-3 times per week.  Has taken singulair in the past which worked well for her.    Relevant past medical, surgical, family and social history reviewed and updated as indicated. Interim medical history since our last visit reviewed. Allergies and medications reviewed and updated.  Review of Systems  Eyes: Negative for visual disturbance.  Respiratory: Positive for shortness of breath. Negative for cough and chest tightness.   Cardiovascular: Negative for chest pain, palpitations and leg swelling.  Neurological: Negative for dizziness and headaches.    Per HPI unless specifically indicated above     Objective:    BP 116/73   Pulse 81   Temp 98.1 F (36.7 C) (Oral)   Wt 226 lb 9.6 oz (102.8 kg)   LMP  (LMP Unknown)   SpO2 97%   BMI 37.71 kg/m   Wt Readings from Last 3 Encounters:  02/20/21 226 lb 9.6 oz (102.8 kg)  02/10/21 229 lb (103.9 kg)  01/21/21 233 lb 2 oz (105.7 kg)    Physical Exam Vitals and nursing note reviewed.  Constitutional:      General:  She is not in acute distress.    Appearance: Normal appearance. She is obese. She is not ill-appearing, toxic-appearing or diaphoretic.  HENT:     Head: Normocephalic.     Right Ear: External ear normal.     Left Ear: External ear normal.     Nose: Nose normal.     Mouth/Throat:     Mouth: Mucous membranes are moist.     Pharynx: Oropharynx is clear.  Eyes:     General:        Right eye: No discharge.        Left eye: No discharge.     Extraocular Movements: Extraocular movements intact.     Conjunctiva/sclera: Conjunctivae normal.     Pupils: Pupils are equal, round, and reactive to light.  Cardiovascular:     Rate and Rhythm: Normal rate and regular rhythm.     Heart sounds: No murmur heard.   Pulmonary:     Effort: Pulmonary effort is normal. No respiratory distress.     Breath sounds: Normal breath sounds. No wheezing or rales.  Musculoskeletal:     Cervical back: Normal range of motion and neck supple.  Skin:    General: Skin is warm and dry.     Capillary Refill: Capillary refill takes less than 2 seconds.  Neurological:     General: No  focal deficit present.     Mental Status: She is alert and oriented to person, place, and time. Mental status is at baseline.  Psychiatric:        Mood and Affect: Mood normal.        Behavior: Behavior normal.        Thought Content: Thought content normal.        Judgment: Judgment normal.      Assessment & Plan:   Problem List Items Addressed This Visit      Cardiovascular and Mediastinum   Primary hypertension - Primary    Chronic.  Controlled. Continue with current medication regimen. Refill sent to the pharmacy.  Return to clinic in 6 months for repeat BP check.      Relevant Medications   valsartan (DIOVAN) 40 MG tablet     Respiratory   Asthma    Chronic. Ongoing.  Begin Singulair daily to help with SOB.  If symptoms do not improve return to clinic for spirometry.  Return in 6 months for reevaluation.        Relevant Medications   montelukast (SINGULAIR) 10 MG tablet       Follow up plan: Return in about 6 months (around 08/23/2021) for BP Check.

## 2021-03-19 DIAGNOSIS — Z419 Encounter for procedure for purposes other than remedying health state, unspecified: Secondary | ICD-10-CM | POA: Diagnosis not present

## 2021-03-20 DIAGNOSIS — H5213 Myopia, bilateral: Secondary | ICD-10-CM | POA: Diagnosis not present

## 2021-04-18 DIAGNOSIS — Z419 Encounter for procedure for purposes other than remedying health state, unspecified: Secondary | ICD-10-CM | POA: Diagnosis not present

## 2021-05-19 DIAGNOSIS — Z419 Encounter for procedure for purposes other than remedying health state, unspecified: Secondary | ICD-10-CM | POA: Diagnosis not present

## 2021-06-19 DIAGNOSIS — Z419 Encounter for procedure for purposes other than remedying health state, unspecified: Secondary | ICD-10-CM | POA: Diagnosis not present

## 2021-07-19 DIAGNOSIS — Z419 Encounter for procedure for purposes other than remedying health state, unspecified: Secondary | ICD-10-CM | POA: Diagnosis not present

## 2021-08-19 DIAGNOSIS — Z419 Encounter for procedure for purposes other than remedying health state, unspecified: Secondary | ICD-10-CM | POA: Diagnosis not present

## 2021-08-21 NOTE — Progress Notes (Deleted)
There were no vitals taken for this visit.   Subjective:    Patient ID: Julie Todd, female    DOB: 1991-09-03, 30 y.o.   MRN: [id number]  HPI: Julie Todd is a 30 y.o. female  No chief complaint on file.  HYPERTENSION Hypertension status: {Blank single:19197::"controlled","uncontrolled","better","worse","exacerbated","stable"}  Satisfied with current treatment? {Blank single:19197::"yes","no"} Duration of hypertension: {Blank single:19197::"chronic","months","years"} BP monitoring frequency:  {Blank single:19197::"not checking","rarely","daily","weekly","monthly","a few times a day","a few times a week","a few times a month"} BP range:  BP medication side effects:  {Blank single:19197::"yes","no"} Medication compliance: {Blank single:19197::"excellent compliance","good compliance","fair compliance","poor compliance"} Previous BP meds:{Blank multiple:19196::"none","amlodipine","amlodipine/benazepril","atenolol","benazepril","benazepril/HCTZ","bisoprolol (bystolic)","carvedilol","chlorthalidone","clonidine","diltiazem","exforge HCT","HCTZ","irbesartan (avapro)","labetalol","lisinopril","lisinopril-HCTZ","losartan (cozaar)","methyldopa","nifedipine","olmesartan (benicar)","olmesartan-HCTZ","quinapril","ramipril","spironalactone","tekturna","valsartan","valsartan-HCTZ","verapamil"} Aspirin: {Blank single:19197::"yes","no"} Recurrent headaches: {Blank single:19197::"yes","no"} Visual changes: {Blank single:19197::"yes","no"} Palpitations: {Blank single:19197::"yes","no"} Dyspnea: {Blank single:19197::"yes","no"} Chest pain: {Blank single:19197::"yes","no"} Lower extremity edema: {Blank single:19197::"yes","no"} Dizzy/lightheaded: {Blank single:19197::"yes","no"}  Relevant past medical, surgical, family and social history reviewed and updated as indicated. Interim medical history since our last visit reviewed. Allergies and medications reviewed and updated.  Review of  Systems  Per HPI unless specifically indicated above     Objective:    There were no vitals taken for this visit.  Wt Readings from Last 3 Encounters:  02/20/21 226 lb 9.6 oz (102.8 kg)  02/10/21 229 lb (103.9 kg)  01/21/21 233 lb 2 oz (105.7 kg)    Physical Exam  Results for orders placed or performed in visit on 02/10/21  Surgical pathology  Result Value Ref Range   SURGICAL PATHOLOGY      SURGICAL PATHOLOGY CASE: [id number] PATIENT: Julie Todd Surgical Pathology Report     Clinical History: LGSIL (cm)   FINAL MICROSCOPIC DIAGNOSIS:  A. CERVIX, 6 O'CLOCK, BIOPSY: -  Low grade squamous intraepithelial lesion (CIN1, mild dysplasia)  B. CERVIX, 12 O'CLOCK, BIOPSY: -  Low grade squamous intraepithelial lesion (CIN1, mild dysplasia) -  See comment  C. ENDOCERVIX, CURETTAGE:  -  Rare atypical squamous cells -  Benign endocervical glandular epithelium -  See comment  COMMENT:  B.  P16 immunohistochemistry supports the diagnosis of low grade dysplasia.  C.  There are scattered atypical squamous cells.  P16 immunohistochemistry shows scattered positive scattered cells; dysplasia is not excluded.  GROSS DESCRIPTION:  A: Received in formalin are tan, soft tissue fragments that are submitted in toto. Number: 3 size: 0.3-0.8 cm blocks: 1  B: Received in formalin is a tan, soft tissue fragment that is submitted in toto.  Size: 0.7 cm, 1  block submitted.  C: Received in formalin is blood tinged mucus that is entirely submitted in one block.  Volume: 1.3 x 1.2 x 0.3 cm (GRP 02/10/2021)     Final Diagnosis performed by Manning Charity, MD.   Electronically signed 02/12/2021 Technical and / or Professional components performed at [facility]. [facility], [address], [place], [place] [address].  Immunohistochemistry Technical component (if applicable) was performed at [facility]. [address], [place], [place]  [address].   IMMUNOHISTOCHEMISTRY DISCLAIMER (if applicable): Some of these immunohistochemical stains may have been developed and the performance characteristics determine by [facility]. Some may not have been cleared or approved by the U.S. Food and Drug Administration. The FDA has determined that such clearance or approval is not necessary. This test is used for clinical purposes. It should not be regarded as investigational or for research. Thi s laboratory is certified under the Clinical Laboratory Improvement Amendments of 1988 (CLIA-88) as qualified to perform high complexity clinical laboratory testing.  The controls stained  appropriately.       Assessment & Plan:   Problem List Items Addressed This Visit       Cardiovascular and Mediastinum   Primary hypertension - Primary     Follow up plan: No follow-ups on file.

## 2021-08-22 ENCOUNTER — Ambulatory Visit: Payer: Medicaid Other | Admitting: Nurse Practitioner

## 2021-08-22 DIAGNOSIS — I10 Essential (primary) hypertension: Secondary | ICD-10-CM

## 2021-09-18 DIAGNOSIS — Z419 Encounter for procedure for purposes other than remedying health state, unspecified: Secondary | ICD-10-CM | POA: Diagnosis not present

## 2021-10-19 DIAGNOSIS — Z419 Encounter for procedure for purposes other than remedying health state, unspecified: Secondary | ICD-10-CM | POA: Diagnosis not present

## 2021-10-24 DIAGNOSIS — Z20822 Contact with and (suspected) exposure to covid-19: Secondary | ICD-10-CM | POA: Diagnosis not present

## 2021-10-24 DIAGNOSIS — Z03818 Encounter for observation for suspected exposure to other biological agents ruled out: Secondary | ICD-10-CM | POA: Diagnosis not present

## 2021-11-18 DIAGNOSIS — Z20822 Contact with and (suspected) exposure to covid-19: Secondary | ICD-10-CM | POA: Diagnosis not present

## 2021-11-18 DIAGNOSIS — Z03818 Encounter for observation for suspected exposure to other biological agents ruled out: Secondary | ICD-10-CM | POA: Diagnosis not present

## 2021-11-19 DIAGNOSIS — Z419 Encounter for procedure for purposes other than remedying health state, unspecified: Secondary | ICD-10-CM | POA: Diagnosis not present

## 2021-12-17 DIAGNOSIS — Z419 Encounter for procedure for purposes other than remedying health state, unspecified: Secondary | ICD-10-CM | POA: Diagnosis not present

## 2022-01-17 DIAGNOSIS — Z419 Encounter for procedure for purposes other than remedying health state, unspecified: Secondary | ICD-10-CM | POA: Diagnosis not present

## 2022-02-16 DIAGNOSIS — Z419 Encounter for procedure for purposes other than remedying health state, unspecified: Secondary | ICD-10-CM | POA: Diagnosis not present

## 2022-02-23 NOTE — Progress Notes (Signed)
? ?BP 132/89   Pulse 88   Temp 98.8 ?F (37.1 ?C) (Oral)   Wt 201 lb 12.8 oz (91.5 kg)   LMP 02/12/2022   SpO2 96%   BMI 33.58 kg/m?   ? ?Subjective:  ? ? Patient ID: Julie Todd, female    DOB: 1991/04/09, 31 y.o.   MRN: [id number] ? ?HPI: ?Farran Amsden is a 31 y.o. female ? ?Chief Complaint  ?Patient presents with  ? Hypertension  ?  Follow up - would like to discuss singulair medication; states it makes her have irregular breathing the morning after taking it   ? ?HYPERTENSION ?Hypertension status: stable  ?Satisfied with current treatment? yes ?Duration of hypertension: months ?BP monitoring frequency:  not checking ?BP range:  ?BP medication side effects:  no ?Medication compliance: excellent compliance ?Previous BP meds:valsartan ?Aspirin: no ?Recurrent headaches: no ?Visual changes: no ?Palpitations: no ?Dyspnea: SOB ?Chest pain: no ?Lower extremity edema: no ?Dizzy/lightheaded: no ? ?ASTHMA ?Patient states she feels like the Singulair is making it harder for her to breath.  She would like to stop the medication to see if it improves.  ? ? ?Relevant past medical, surgical, family and social history reviewed and updated as indicated. Interim medical history since our last visit reviewed. ?Allergies and medications reviewed and updated. ? ?Review of Systems  ?Eyes:  Negative for visual disturbance.  ?Respiratory:  Positive for shortness of breath. Negative for cough and chest tightness.   ?Cardiovascular:  Negative for chest pain, palpitations and leg swelling.  ?Neurological:  Negative for dizziness and headaches.  ? ?Per HPI unless specifically indicated above ? ?   ?Objective:  ?  ?BP 132/89   Pulse 88   Temp 98.8 ?F (37.1 ?C) (Oral)   Wt 201 lb 12.8 oz (91.5 kg)   LMP 02/12/2022   SpO2 96%   BMI 33.58 kg/m?   ?Wt Readings from Last 3 Encounters:  ?02/24/22 201 lb 12.8 oz (91.5 kg)  ?02/20/21 226 lb 9.6 oz (102.8 kg)  ?02/10/21 229 lb (103.9 kg)  ?  ?Physical Exam ?Vitals and nursing note  reviewed.  ?Constitutional:   ?   General: She is not in acute distress. ?   Appearance: Normal appearance. She is obese. She is not ill-appearing, toxic-appearing or diaphoretic.  ?HENT:  ?   Head: Normocephalic.  ?   Right Ear: External ear normal.  ?   Left Ear: External ear normal.  ?   Nose: Nose normal.  ?   Mouth/Throat:  ?   Mouth: Mucous membranes are moist.  ?   Pharynx: Oropharynx is clear.  ?Eyes:  ?   General:     ?   Right eye: No discharge.     ?   Left eye: No discharge.  ?   Extraocular Movements: Extraocular movements intact.  ?   Conjunctiva/sclera: Conjunctivae normal.  ?   Pupils: Pupils are equal, round, and reactive to light.  ?Cardiovascular:  ?   Rate and Rhythm: Normal rate and regular rhythm.  ?   Heart sounds: No murmur heard. ?Pulmonary:  ?   Effort: Pulmonary effort is normal. No respiratory distress.  ?   Breath sounds: Normal breath sounds. No wheezing or rales.  ?Musculoskeletal:  ?   Cervical back: Normal range of motion and neck supple.  ?Skin: ?   General: Skin is warm and dry.  ?   Capillary Refill: Capillary refill takes less than 2 seconds.  ?Neurological:  ?   General: No  focal deficit present.  ?   Mental Status: She is alert and oriented to person, place, and time. Mental status is at baseline.  ?Psychiatric:     ?   Mood and Affect: Mood normal.     ?   Behavior: Behavior normal.     ?   Thought Content: Thought content normal.     ?   Judgment: Judgment normal.  ? ? ? ?Assessment & Plan:  ? ?Problem List Items Addressed This Visit   ? ?  ? Cardiovascular and Mediastinum  ? Primary hypertension - Primary  ?  Chronic.  Controlled.  Continue with current medication regimen on Valsartan 9m daily.  Refill sent today.  Labs ordered today.  Return to clinic in 6 months for reevaluation.  Call sooner if concerns arise.  ? ? ?  ?  ? Relevant Medications  ? valsartan (DIOVAN) 40 MG tablet  ? Other Relevant Orders  ? Comp Met (CMET)  ? Lipid Profile  ?  ? Respiratory  ? Asthma  ?   Chronic. Feels like the singulair is making her breathing worse.  Will stop Singulair to see if symptoms improve.  Follow up in 6 months for reevaluation.  ? ?  ?  ? Relevant Medications  ? albuterol (VENTOLIN HFA) 108 (90 Base) MCG/ACT inhaler  ? Other Relevant Orders  ? Comp Met (CMET)  ? ?Other Visit Diagnoses   ? ? Screening for ischemic heart disease      ? Relevant Orders  ? Lipid Profile  ? ?  ?  ? ?Follow up plan: ?Return in about 6 months (around 08/27/2022) for Physical and Fasting labs. ? ? ? ? ? ? ?

## 2022-02-24 ENCOUNTER — Ambulatory Visit: Payer: Medicaid Other | Admitting: Nurse Practitioner

## 2022-02-24 ENCOUNTER — Encounter: Payer: Self-pay | Admitting: Nurse Practitioner

## 2022-02-24 VITALS — BP 132/89 | HR 88 | Temp 98.8°F | Wt 201.8 lb

## 2022-02-24 DIAGNOSIS — I10 Essential (primary) hypertension: Secondary | ICD-10-CM

## 2022-02-24 DIAGNOSIS — J45909 Unspecified asthma, uncomplicated: Secondary | ICD-10-CM | POA: Diagnosis not present

## 2022-02-24 DIAGNOSIS — Z136 Encounter for screening for cardiovascular disorders: Secondary | ICD-10-CM | POA: Diagnosis not present

## 2022-02-24 MED ORDER — ALBUTEROL SULFATE HFA 108 (90 BASE) MCG/ACT IN AERS: 2.0000 | INHALATION_SPRAY | Freq: Four times a day (QID) | RESPIRATORY_TRACT | 0 refills | Status: DC | PRN

## 2022-02-24 MED ORDER — VALSARTAN 40 MG PO TABS: 40.0000 mg | ORAL_TABLET | Freq: Every day | ORAL | 1 refills | Status: DC

## 2022-02-24 NOTE — Assessment & Plan Note (Signed)
Chronic. Feels like the singulair is making her breathing worse.  Will stop Singulair to see if symptoms improve.  Follow up in 6 months for reevaluation.  ?

## 2022-02-24 NOTE — Assessment & Plan Note (Signed)
Chronic.  Controlled.  Continue with current medication regimen on Valsartan 40mg  daily.  Refill sent today.  Labs ordered today.  Return to clinic in 6 months for reevaluation.  Call sooner if concerns arise.  ? ?

## 2022-02-25 LAB — COMPREHENSIVE METABOLIC PANEL
ALT: 21 IU/L (ref 0–32)
AST: 25 IU/L (ref 0–40)
Albumin/Globulin Ratio: 1.4 (ref 1.2–2.2)
Albumin: 4.2 g/dL (ref 3.8–4.8)
Alkaline Phosphatase: 100 IU/L (ref 44–121)
BUN/Creatinine Ratio: 8 — ABNORMAL LOW (ref 9–23)
BUN: 6 mg/dL (ref 6–20)
Bilirubin Total: 0.5 mg/dL (ref 0.0–1.2)
CO2: 21 mmol/L (ref 20–29)
Calcium: 9.2 mg/dL (ref 8.7–10.2)
Chloride: 105 mmol/L (ref 96–106)
Creatinine, Ser: 0.74 mg/dL (ref 0.57–1.00)
Globulin, Total: 2.9 g/dL (ref 1.5–4.5)
Glucose: 70 mg/dL (ref 70–99)
Potassium: 3.9 mmol/L (ref 3.5–5.2)
Sodium: 144 mmol/L (ref 134–144)
Total Protein: 7.1 g/dL (ref 6.0–8.5)
eGFR: 109 mL/min/{1.73_m2} (ref >59)

## 2022-02-25 LAB — LIPID PANEL
Chol/HDL Ratio: 2.7 ratio (ref 0.0–4.4)
Cholesterol, Total: 133 mg/dL (ref 100–199)
HDL: 50 mg/dL (ref >39)
LDL Chol Calc (NIH): 71 mg/dL (ref 0–99)
Triglycerides: 56 mg/dL (ref 0–149)
VLDL Cholesterol Cal: 12 mg/dL (ref 5–40)

## 2022-02-25 NOTE — Progress Notes (Signed)
[place]. It was nice to see you yesterday.  Your lab work looks good.  No concerns at this time. Continue with your current medication regimen.  Follow up as discussed.  Please let me know if you have any questions.

## 2022-03-19 DIAGNOSIS — Z419 Encounter for procedure for purposes other than remedying health state, unspecified: Secondary | ICD-10-CM | POA: Diagnosis not present

## 2022-04-18 DIAGNOSIS — Z419 Encounter for procedure for purposes other than remedying health state, unspecified: Secondary | ICD-10-CM | POA: Diagnosis not present

## 2022-05-18 DIAGNOSIS — S93401A Sprain of unspecified ligament of right ankle, initial encounter: Secondary | ICD-10-CM | POA: Diagnosis not present

## 2022-05-18 DIAGNOSIS — M25571 Pain in right ankle and joints of right foot: Secondary | ICD-10-CM | POA: Diagnosis not present

## 2022-05-19 DIAGNOSIS — Z419 Encounter for procedure for purposes other than remedying health state, unspecified: Secondary | ICD-10-CM | POA: Diagnosis not present

## 2022-05-22 ENCOUNTER — Ambulatory Visit: Payer: Medicaid Other | Admitting: Physician Assistant

## 2022-05-22 ENCOUNTER — Encounter: Payer: Self-pay | Admitting: Physician Assistant

## 2022-05-22 ENCOUNTER — Ambulatory Visit (INDEPENDENT_AMBULATORY_CARE_PROVIDER_SITE_OTHER): Payer: Medicaid Other | Admitting: Physician Assistant

## 2022-05-22 VITALS — BP 120/83 | HR 87 | Temp 98.9°F | Ht 65.0 in | Wt 195.2 lb

## 2022-05-22 DIAGNOSIS — Z1159 Encounter for screening for other viral diseases: Secondary | ICD-10-CM | POA: Diagnosis not present

## 2022-05-22 DIAGNOSIS — S93401D Sprain of unspecified ligament of right ankle, subsequent encounter: Secondary | ICD-10-CM

## 2022-05-22 NOTE — Patient Instructions (Addendum)
  You can take Tylenol 650 mg every 6 hours as needed  No more than 3500 mg total per day  You can take Ibuprofen 400 mg every 4- 6 hours as needed  No more than 3200 mg total per day.   Make sure you take these with food and drink plenty of water.

## 2022-05-22 NOTE — Progress Notes (Signed)
Acute Office Visit   Patient: Julie Todd   DOB: Dec 26, 1990   31 y.o. Female  MRN: [id number] Visit Date: 05/22/2022  Today's healthcare provider: Oswaldo Conroy Nkenge Sonntag, PA-C  Introduced myself to the patient as a Secondary school teacher and provided education on APPs in clinical practice.    Chief Complaint  Patient presents with   Hep C     Patient is here for either the vaccine or screening, She can't remember   foot pain    Sprained on Sunday, started swelling more, since being evaluated at [facility] on Monday. Patient is concerned that the Right foot is bruising more and swelling more   Subjective    HPI HPI     Hep C     Additional comments: Patient is here for either the vaccine or screening, She can't remember        foot pain    Additional comments: Sprained on Sunday, started swelling more, since being evaluated at [facility] on Monday. Patient is concerned that the Right foot is bruising more and swelling more      Last edited by Sherolyn Buba, CMA on 05/22/2022  2:52 PM.       Hep C States she would like screening for Hep C as she received a notification that she is due for this   Sprained ankle, right Reports she is having difficulty with swelling and pain Reports swelling is spreading to her toes Sprained it on Sunday States she had imaging performed at [facility] and they did not see any fractures Interventions: Alternating Tylenol and Ibuprofen, elevating and icing Aggravating: Alleviating: Taking Tylenol once per day and Ibuprofen once per day     Medications: Outpatient Medications Prior to Visit  Medication Sig   albuterol (VENTOLIN HFA) 108 (90 Base) MCG/ACT inhaler Inhale 2 puffs into the lungs every 6 (six) hours as needed for wheezing or shortness of breath.   valsartan (DIOVAN) 40 MG tablet Take 1 tablet (40 mg total) by mouth daily.   montelukast (SINGULAIR) 10 MG tablet Take 1 tablet (10 mg total) by mouth at bedtime. (Patient not taking: Reported on 05/22/2022)   No  facility-administered medications prior to visit.    Review of Systems  Musculoskeletal:  Positive for joint swelling and myalgias.       Right ankle swelling and pain following sprain        Objective    BP 120/83   Pulse 87   Temp 98.9 F (37.2 C) (Oral)   Ht 5\' 5"  (1.651 m)   Wt 195 lb 3.2 oz (88.5 kg)   SpO2 98%   BMI 32.48 kg/m    Physical Exam Vitals reviewed.  Constitutional:      General: She is awake.     Appearance: Normal appearance. She is well-developed, well-groomed and normal weight.  HENT:     Head: Normocephalic and atraumatic.  Eyes:     General: Lids are normal. Gaze aligned appropriately.     Extraocular Movements: Extraocular movements intact.     Conjunctiva/sclera: Conjunctivae normal.  Pulmonary:     Effort: Pulmonary effort is normal.  Musculoskeletal:     Right ankle: Swelling and ecchymosis present. No deformity or lacerations. Tenderness present over the lateral malleolus. Anterior drawer test negative. Normal pulse.     Left ankle: No swelling.     Comments: Decreased active ROM Passive ROM intact with flexion, extension, pronation, supination, medial and lateral rotation  Tenderness  to palpation with lateral ankle palpation Dorsalis pedis pulse intact on right  Mild swelling noted from ankle to toe bases   Neurological:     Mental Status: She is alert.  Psychiatric:        Attention and Perception: Attention and perception normal.        Mood and Affect: Mood and affect normal.        Speech: Speech normal.        Behavior: Behavior normal. Behavior is cooperative.       No results found for any visits on 05/22/22.  Assessment & Plan      No follow-ups on file.      Problem List Items Addressed This Visit   None Visit Diagnoses     Sprain of right ankle, unspecified ligament, subsequent encounter    -  Primary Acute, ongoing concern Reports ongoing pain, swelling and bruising States she went to [facility] and xrays did not  show fracture States she has been elevating it and icing, has been taking single doses of Tylenol and Ibuprofen per day  Reviewed more frequent dosing schedule of these medications and recommend a more rigid brace for ankle to provide stability  Recommend she commence gentle stretches in about a week to help prevent stiffness Follow up as needed    Encounter for hepatitis C screening test for low risk patient     Reports she was told she is due for this from a message in MyChart States she is due for her yearly physical in a few weeks and will have labs performed at that time Scheduled Hep C testing to coincide with this lab draw     Relevant Orders   Hepatitis C antibody        No follow-ups on file.   I, Micaila Ziemba E Melquiades Kovar, PA-C, have reviewed all documentation for this visit. The documentation on 05/22/22 for the exam, diagnosis, procedures, and orders are all accurate and complete.   Jacquelin Hawking, MHS, PA-C [facility]

## 2022-05-22 NOTE — Progress Notes (Deleted)
Established Patient Office Visit  Name: Julie Todd   MRN: [id number]    DOB: 07/21/1991   Date:05/22/2022  Today's Provider: Talitha Givens, MHS, PA-C Introduced myself to the patient as a PA-C and provided education on APPs in clinical practice.         Subjective  Chief Complaint  No chief complaint on file.   HPI   Patient Active Problem List   Diagnosis Date Noted   Asthma 02/20/2021   Primary hypertension 01/21/2021    Past Surgical History:  Procedure Laterality Date   TENDON TRANSPLANT      Family History  Problem Relation Age of Onset   Lupus Maternal Grandmother     Social History   Tobacco Use   Smoking status: Never   Smokeless tobacco: Never  Substance Use Topics   Alcohol use: Yes    Alcohol/week: 4.0 standard drinks of alcohol    Types: 4 Standard drinks or equivalent per week     Current Outpatient Medications:    albuterol (VENTOLIN HFA) 108 (90 Base) MCG/ACT inhaler, Inhale 2 puffs into the lungs every 6 (six) hours as needed for wheezing or shortness of breath., Disp: 18 g, Rfl: 0   montelukast (SINGULAIR) 10 MG tablet, Take 1 tablet (10 mg total) by mouth at bedtime., Disp: 90 tablet, Rfl: 1   valsartan (DIOVAN) 40 MG tablet, Take 1 tablet (40 mg total) by mouth daily., Disp: 90 tablet, Rfl: 1  No Known Allergies  I personally reviewed {Reviewed:14835} with the patient/caregiver today.   ROS    Objective  There were no vitals filed for this visit.  There is no height or weight on file to calculate BMI.  Physical Exam   Recent Results (from the past 2160 hour(s))  Comp Met (CMET)     Status: Abnormal   Collection Time: 02/24/22 11:10 AM  Result Value Ref Range   Glucose 70 70 - 99 mg/dL   BUN 6 6 - 20 mg/dL   Creatinine, Ser 0.74 0.57 - 1.00 mg/dL   eGFR 109 >59 mL/min/1.73   BUN/Creatinine Ratio 8 (L) 9 - 23   Sodium 144 134 - 144 mmol/L   Potassium 3.9 3.5 - 5.2 mmol/L   Chloride 105 96 - 106 mmol/L   CO2 21  20 - 29 mmol/L   Calcium 9.2 8.7 - 10.2 mg/dL   Total Protein 7.1 6.0 - 8.5 g/dL   Albumin 4.2 3.8 - 4.8 g/dL   Globulin, Total 2.9 1.5 - 4.5 g/dL   Albumin/Globulin Ratio 1.4 1.2 - 2.2   Bilirubin Total 0.5 0.0 - 1.2 mg/dL   Alkaline Phosphatase 100 44 - 121 IU/L   AST 25 0 - 40 IU/L   ALT 21 0 - 32 IU/L  Lipid Profile     Status: None   Collection Time: 02/24/22 11:10 AM  Result Value Ref Range   Cholesterol, Total 133 100 - 199 mg/dL   Triglycerides 56 0 - 149 mg/dL   HDL 50 >39 mg/dL   VLDL Cholesterol Cal 12 5 - 40 mg/dL   LDL Chol Calc (NIH) 71 0 - 99 mg/dL   Chol/HDL Ratio 2.7 0.0 - 4.4 ratio    Comment:                                   T. Chol/HDL Ratio  Men  Women                               1/2 Avg.Risk  3.4    3.3                                   Avg.Risk  5.0    4.4                                2X Avg.Risk  9.6    7.1                                3X Avg.Risk 23.4   11.0      PHQ2/9:    02/24/2022   10:58 AM 01/20/2021    3:00 PM  Depression screen PHQ 2/9  Decreased Interest 1 0  Down, Depressed, Hopeless 1 0  PHQ - 2 Score 2 0  Altered sleeping 2   Tired, decreased energy 1   Change in appetite 3   Feeling bad or failure about yourself  0   Trouble concentrating 1   Moving slowly or fidgety/restless 0   Suicidal thoughts 0   PHQ-9 Score 9   Difficult doing work/chores Not difficult at all       Fall Risk:    02/24/2022   10:58 AM 01/21/2021   10:18 AM 01/20/2021    3:00 PM  [place] in the past year? 0 0 0  Number falls in past yr: 0 0 0  Injury with Fall? 0 0 0  Risk for fall due to : No Fall Risks    Follow up Falls evaluation completed        Functional Status Survey:      Assessment & Plan

## 2022-06-19 DIAGNOSIS — Z419 Encounter for procedure for purposes other than remedying health state, unspecified: Secondary | ICD-10-CM | POA: Diagnosis not present

## 2022-07-19 DIAGNOSIS — Z419 Encounter for procedure for purposes other than remedying health state, unspecified: Secondary | ICD-10-CM | POA: Diagnosis not present

## 2022-08-12 DIAGNOSIS — Z20822 Contact with and (suspected) exposure to covid-19: Secondary | ICD-10-CM | POA: Diagnosis not present

## 2022-08-12 DIAGNOSIS — R519 Headache, unspecified: Secondary | ICD-10-CM | POA: Diagnosis not present

## 2022-08-12 DIAGNOSIS — J329 Chronic sinusitis, unspecified: Secondary | ICD-10-CM | POA: Diagnosis not present

## 2022-08-19 DIAGNOSIS — Z419 Encounter for procedure for purposes other than remedying health state, unspecified: Secondary | ICD-10-CM | POA: Diagnosis not present

## 2022-08-27 ENCOUNTER — Encounter: Payer: Medicaid Other | Admitting: Nurse Practitioner

## 2022-09-18 DIAGNOSIS — Z419 Encounter for procedure for purposes other than remedying health state, unspecified: Secondary | ICD-10-CM | POA: Diagnosis not present

## 2022-10-19 DIAGNOSIS — Z419 Encounter for procedure for purposes other than remedying health state, unspecified: Secondary | ICD-10-CM | POA: Diagnosis not present

## 2022-10-23 DIAGNOSIS — Z20822 Contact with and (suspected) exposure to covid-19: Secondary | ICD-10-CM | POA: Diagnosis not present

## 2022-10-23 DIAGNOSIS — J111 Influenza due to unidentified influenza virus with other respiratory manifestations: Secondary | ICD-10-CM | POA: Diagnosis not present

## 2022-10-23 DIAGNOSIS — R059 Cough, unspecified: Secondary | ICD-10-CM | POA: Diagnosis not present

## 2022-11-19 DIAGNOSIS — Z419 Encounter for procedure for purposes other than remedying health state, unspecified: Secondary | ICD-10-CM | POA: Diagnosis not present

## 2022-12-18 DIAGNOSIS — Z419 Encounter for procedure for purposes other than remedying health state, unspecified: Secondary | ICD-10-CM | POA: Diagnosis not present

## 2022-12-24 DIAGNOSIS — F17210 Nicotine dependence, cigarettes, uncomplicated: Secondary | ICD-10-CM | POA: Diagnosis not present

## 2022-12-24 DIAGNOSIS — S300XXA Contusion of lower back and pelvis, initial encounter: Secondary | ICD-10-CM | POA: Diagnosis not present

## 2022-12-24 DIAGNOSIS — M549 Dorsalgia, unspecified: Secondary | ICD-10-CM | POA: Diagnosis not present

## 2022-12-24 DIAGNOSIS — M545 Low back pain, unspecified: Secondary | ICD-10-CM | POA: Diagnosis not present

## 2023-01-18 DIAGNOSIS — Z419 Encounter for procedure for purposes other than remedying health state, unspecified: Secondary | ICD-10-CM | POA: Diagnosis not present

## 2023-01-25 ENCOUNTER — Telehealth: Payer: Self-pay | Admitting: Nurse Practitioner

## 2023-01-25 NOTE — Telephone Encounter (Signed)
LVM for patient to call PCP to schedule CPE to address HTN and Asthma for [employer].

## 2023-02-17 DIAGNOSIS — Z419 Encounter for procedure for purposes other than remedying health state, unspecified: Secondary | ICD-10-CM | POA: Diagnosis not present

## 2023-03-01 DIAGNOSIS — F4310 Post-traumatic stress disorder, unspecified: Secondary | ICD-10-CM | POA: Diagnosis not present

## 2023-03-20 DIAGNOSIS — Z419 Encounter for procedure for purposes other than remedying health state, unspecified: Secondary | ICD-10-CM | POA: Diagnosis not present

## 2023-03-23 DIAGNOSIS — F4310 Post-traumatic stress disorder, unspecified: Secondary | ICD-10-CM | POA: Diagnosis not present

## 2023-03-30 DIAGNOSIS — F4310 Post-traumatic stress disorder, unspecified: Secondary | ICD-10-CM | POA: Diagnosis not present

## 2023-04-06 DIAGNOSIS — F4310 Post-traumatic stress disorder, unspecified: Secondary | ICD-10-CM | POA: Diagnosis not present

## 2023-04-14 DIAGNOSIS — F4310 Post-traumatic stress disorder, unspecified: Secondary | ICD-10-CM | POA: Diagnosis not present

## 2023-04-19 DIAGNOSIS — Z419 Encounter for procedure for purposes other than remedying health state, unspecified: Secondary | ICD-10-CM | POA: Diagnosis not present

## 2023-04-21 DIAGNOSIS — F4310 Post-traumatic stress disorder, unspecified: Secondary | ICD-10-CM | POA: Diagnosis not present

## 2023-04-30 DIAGNOSIS — F4310 Post-traumatic stress disorder, unspecified: Secondary | ICD-10-CM | POA: Diagnosis not present

## 2023-05-04 DIAGNOSIS — F4310 Post-traumatic stress disorder, unspecified: Secondary | ICD-10-CM | POA: Diagnosis not present

## 2023-05-20 DIAGNOSIS — Z419 Encounter for procedure for purposes other than remedying health state, unspecified: Secondary | ICD-10-CM | POA: Diagnosis not present

## 2023-06-16 DIAGNOSIS — F4310 Post-traumatic stress disorder, unspecified: Secondary | ICD-10-CM | POA: Diagnosis not present

## 2023-06-20 DIAGNOSIS — Z419 Encounter for procedure for purposes other than remedying health state, unspecified: Secondary | ICD-10-CM | POA: Diagnosis not present

## 2023-06-23 DIAGNOSIS — F4310 Post-traumatic stress disorder, unspecified: Secondary | ICD-10-CM | POA: Diagnosis not present

## 2023-07-07 DIAGNOSIS — F4310 Post-traumatic stress disorder, unspecified: Secondary | ICD-10-CM | POA: Diagnosis not present

## 2023-07-14 DIAGNOSIS — F4310 Post-traumatic stress disorder, unspecified: Secondary | ICD-10-CM | POA: Diagnosis not present

## 2023-07-20 DIAGNOSIS — Z419 Encounter for procedure for purposes other than remedying health state, unspecified: Secondary | ICD-10-CM | POA: Diagnosis not present

## 2023-07-21 DIAGNOSIS — F4310 Post-traumatic stress disorder, unspecified: Secondary | ICD-10-CM | POA: Diagnosis not present

## 2023-07-22 ENCOUNTER — Other Ambulatory Visit: Payer: Self-pay | Admitting: Nurse Practitioner

## 2023-08-04 DIAGNOSIS — F4310 Post-traumatic stress disorder, unspecified: Secondary | ICD-10-CM | POA: Diagnosis not present

## 2023-08-09 DIAGNOSIS — F4310 Post-traumatic stress disorder, unspecified: Secondary | ICD-10-CM | POA: Diagnosis not present

## 2023-08-16 NOTE — Progress Notes (Deleted)
There were no vitals taken for this visit.   Subjective:    Patient ID: Julie Todd, female    DOB: 24-Jan-1991, 32 y.o.   MRN: [id number]  HPI: Julie Todd is a 32 y.o. female presenting on 08/17/2023 for comprehensive medical examination. Current medical complaints include:{Blank single:19197::"none","***"}  She currently lives with: Menopausal Symptoms: {Blank single:19197::"yes","no"}  ASTHMA Asthma status: {Blank single:19197::"controlled","uncontrolled","better","worse","exacerbated","stable"} Satisfied with current treatment?: {Blank single:19197::"yes","no"} Albuterol/rescue inhaler frequency:  Dyspnea frequency:  Wheezing frequency: Cough frequency:  Nocturnal symptom frequency:  Limitation of activity: {Blank single:19197::"yes","no"} Current upper respiratory symptoms: {Blank single:19197::"yes","no"} Triggers:  Home peak flows: Last Spirometry:  Failed/intolerant to following asthma meds:  Asthma meds in past:  Aerochamber/spacer use: {Blank single:19197::"yes","no"} Visits to ER or Urgent Care in past year: {Blank single:19197::"yes","no"} Pneumovax: {Blank single:19197::"Up to Date","Not up to Date","unknown"} Influenza: {Blank single:19197::"Up to Date","Not up to Date","unknown"}  HYPERTENSION {Blank single:19197::"without","with"} Chronic Kidney Disease Hypertension status: {Blank single:19197::"controlled","uncontrolled","better","worse","exacerbated","stable"}  Satisfied with current treatment? {Blank single:19197::"yes","no"} Duration of hypertension: {Blank single:19197::"chronic","months","years"} BP monitoring frequency:  {Blank single:19197::"not checking","rarely","daily","weekly","monthly","a few times a day","a few times a week","a few times a month"} BP range:  BP medication side effects:  {Blank single:19197::"yes","no"} Medication compliance: {Blank single:19197::"excellent compliance","good compliance","fair compliance","poor  compliance"} Previous BP meds:{Blank multiple:19196::"none","amlodipine","amlodipine/benazepril","atenolol","benazepril","benazepril/HCTZ","bisoprolol (bystolic)","carvedilol","chlorthalidone","clonidine","diltiazem","exforge HCT","HCTZ","irbesartan (avapro)","labetalol","lisinopril","lisinopril-HCTZ","losartan (cozaar)","methyldopa","nifedipine","olmesartan (benicar)","olmesartan-HCTZ","quinapril","ramipril","spironalactone","tekturna","valsartan","valsartan-HCTZ","verapamil"} Aspirin: {Blank single:19197::"yes","no"} Recurrent headaches: {Blank single:19197::"yes","no"} Visual changes: {Blank single:19197::"yes","no"} Palpitations: {Blank single:19197::"yes","no"} Dyspnea: {Blank single:19197::"yes","no"} Chest pain: {Blank single:19197::"yes","no"} Lower extremity edema: {Blank single:19197::"yes","no"} Dizzy/lightheaded: {Blank single:19197::"yes","no"}   Depression Screen done today and results listed below:     02/24/2022   10:58 AM 01/20/2021    3:00 PM  Depression screen PHQ 2/9  Decreased Interest 1 0  Down, Depressed, Hopeless 1 0  PHQ - 2 Score 2 0  Altered sleeping 2   Tired, decreased energy 1   Change in appetite 3   Feeling bad or failure about yourself  0   Trouble concentrating 1   Moving slowly or fidgety/restless 0   Suicidal thoughts 0   PHQ-9 Score 9   Difficult doing work/chores Not difficult at all     The patient {has/does not ONGE:95284} a history of falls. I {did/did not:19850} complete a risk assessment for falls. A plan of care for falls {was/was not:19852} documented.   Past Medical History:  Past Medical History:  Diagnosis Date   Abnormal Pap smear of cervix     Surgical History:  Past Surgical History:  Procedure Laterality Date   TENDON TRANSPLANT      Medications:  Current Outpatient Medications on File Prior to Visit  Medication Sig   albuterol (VENTOLIN HFA) 108 (90 Base) MCG/ACT inhaler Inhale 2 puffs into the lungs every 6 (six)  hours as needed for wheezing or shortness of breath.   montelukast (SINGULAIR) 10 MG tablet Take 1 tablet (10 mg total) by mouth at bedtime. (Patient not taking: Reported on 05/22/2022)   valsartan (DIOVAN) 40 MG tablet Take 1 tablet (40 mg total) by mouth daily.   No current facility-administered medications on file prior to visit.    Allergies:  No Known Allergies  Social History:  Social History   Socioeconomic History   Marital status: Unknown    Spouse name: Not on file   Number of children: Not on file   Years of education: Not on file   Highest education level: Not on file  Occupational History   Not on file  Tobacco Use   Smoking status: Never   Smokeless tobacco: Never  Vaping Use  Vaping status: Former  Substance and Sexual Activity   Alcohol use: Yes    Alcohol/week: 4.0 standard drinks of alcohol    Types: 4 Standard drinks or equivalent per week   Drug use: Not Currently   Sexual activity: Yes    Birth control/protection: None  Other Topics Concern   Not on file  Social History Narrative   Not on file   Social Determinants of Health   Financial Resource Strain: Not on file  Food Insecurity: Not on file  Transportation Needs: Not on file  Physical Activity: Not on file  Stress: Not on file  Social Connections: Not on file  Intimate Partner Violence: Not on file   Social History   Tobacco Use  Smoking Status Never  Smokeless Tobacco Never   Social History   Substance and Sexual Activity  Alcohol Use Yes   Alcohol/week: 4.0 standard drinks of alcohol   Types: 4 Standard drinks or equivalent per week    Family History:  Family History  Problem Relation Age of Onset   Lupus Maternal Grandmother     Past medical history, surgical history, medications, allergies, family history and social history reviewed with patient today and changes made to appropriate areas of the chart.   ROS All other ROS negative except what is listed above and in the  HPI.      Objective:    There were no vitals taken for this visit.  Wt Readings from Last 3 Encounters:  05/22/22 195 lb 3.2 oz (88.5 kg)  02/24/22 201 lb 12.8 oz (91.5 kg)  02/20/21 226 lb 9.6 oz (102.8 kg)    Physical Exam  Results for orders placed or performed in visit on 02/24/22  Comp Met (CMET)  Result Value Ref Range   Glucose 70 70 - 99 mg/dL   BUN 6 6 - 20 mg/dL   Creatinine, Ser 8.46 0.57 - 1.00 mg/dL   eGFR 962 >95 MW/UXL/2.44   BUN/Creatinine Ratio 8 (L) 9 - 23   Sodium 144 134 - 144 mmol/L   Potassium 3.9 3.5 - 5.2 mmol/L   Chloride 105 96 - 106 mmol/L   CO2 21 20 - 29 mmol/L   Calcium 9.2 8.7 - 10.2 mg/dL   Total Protein 7.1 6.0 - 8.5 g/dL   Albumin 4.2 3.8 - 4.8 g/dL   Globulin, Total 2.9 1.5 - 4.5 g/dL   Albumin/Globulin Ratio 1.4 1.2 - 2.2   Bilirubin Total 0.5 0.0 - 1.2 mg/dL   Alkaline Phosphatase 100 44 - 121 IU/L   AST 25 0 - 40 IU/L   ALT 21 0 - 32 IU/L  Lipid Profile  Result Value Ref Range   Cholesterol, Total 133 100 - 199 mg/dL   Triglycerides 56 0 - 149 mg/dL   HDL 50 >01 mg/dL   VLDL Cholesterol Cal 12 5 - 40 mg/dL   LDL Chol Calc (NIH) 71 0 - 99 mg/dL   Chol/HDL Ratio 2.7 0.0 - 4.4 ratio      Assessment & Plan:   Problem List Items Addressed This Visit       Cardiovascular and Mediastinum   Primary hypertension     Respiratory   Asthma - Primary     Follow up plan: No follow-ups on file.   LABORATORY TESTING:  - Pap smear: {Blank single:19197::"pap done","not applicable","up to date","done elsewhere"}  IMMUNIZATIONS:   - Tdap: Tetanus vaccination status reviewed: {tetanus status:315746}. - Influenza: {Blank single:19197::"Up to date","Administered today","Postponed to flu season","Refused","Given  elsewhere"} - Pneumovax: {Blank single:19197::"Up to date","Administered today","Not applicable","Refused","Given elsewhere"} - Prevnar: {Blank single:19197::"Up to date","Administered today","Not applicable","Refused","Given  elsewhere"} - COVID: {Blank single:19197::"Up to date","Administered today","Not applicable","Refused","Given elsewhere"} - HPV: {Blank single:19197::"Up to date","Administered today","Not applicable","Refused","Given elsewhere"} - Shingrix vaccine: {Blank single:19197::"Up to date","Administered today","Not applicable","Refused","Given elsewhere"}  SCREENING: -Mammogram: {Blank single:19197::"Up to date","Ordered today","Not applicable","Refused","Done elsewhere"}  - Colonoscopy: {Blank single:19197::"Up to date","Ordered today","Not applicable","Refused","Done elsewhere"}  - Bone Density: {Blank single:19197::"Up to date","Ordered today","Not applicable","Refused","Done elsewhere"}  -Hearing Test: {Blank single:19197::"Up to date","Ordered today","Not applicable","Refused","Done elsewhere"}  -Spirometry: {Blank single:19197::"Up to date","Ordered today","Not applicable","Refused","Done elsewhere"}   PATIENT COUNSELING:   Advised to take 1 mg of folate supplement per day if capable of pregnancy.   Sexuality: Discussed sexually transmitted diseases, partner selection, use of condoms, avoidance of unintended pregnancy  and contraceptive alternatives.   Advised to avoid cigarette smoking.  I discussed with the patient that most people either abstain from alcohol or drink within safe limits (<=14/week and <=4 drinks/occasion for males, <=7/weeks and <= 3 drinks/occasion for females) and that the risk for alcohol disorders and other health effects rises proportionally with the number of drinks per week and how often a drinker exceeds daily limits.  Discussed cessation/primary prevention of drug use and availability of treatment for abuse.   Diet: Encouraged to adjust caloric intake to maintain  or achieve ideal body weight, to reduce intake of dietary saturated fat and total fat, to limit sodium intake by avoiding high sodium foods and not adding table salt, and to maintain adequate dietary  potassium and calcium preferably from fresh fruits, vegetables, and low-fat dairy products.    stressed the importance of regular exercise  Injury prevention: Discussed safety belts, safety helmets, smoke detector, smoking near bedding or upholstery.   Dental health: Discussed importance of regular tooth brushing, flossing, and dental visits.    NEXT PREVENTATIVE PHYSICAL DUE IN 1 YEAR. No follow-ups on file.

## 2023-08-17 ENCOUNTER — Ambulatory Visit: Payer: Medicaid Other | Admitting: Nurse Practitioner

## 2023-08-17 DIAGNOSIS — Z1159 Encounter for screening for other viral diseases: Secondary | ICD-10-CM

## 2023-08-17 DIAGNOSIS — I10 Essential (primary) hypertension: Secondary | ICD-10-CM

## 2023-08-17 DIAGNOSIS — Z136 Encounter for screening for cardiovascular disorders: Secondary | ICD-10-CM

## 2023-08-17 DIAGNOSIS — Z0000 Encounter for general adult medical examination without abnormal findings: Secondary | ICD-10-CM

## 2023-08-17 DIAGNOSIS — J45909 Unspecified asthma, uncomplicated: Secondary | ICD-10-CM

## 2023-08-17 DIAGNOSIS — Z114 Encounter for screening for human immunodeficiency virus [HIV]: Secondary | ICD-10-CM

## 2023-08-18 ENCOUNTER — Telehealth: Payer: Medicaid Other | Admitting: Nurse Practitioner

## 2023-08-18 DIAGNOSIS — F4310 Post-traumatic stress disorder, unspecified: Secondary | ICD-10-CM | POA: Diagnosis not present

## 2023-08-20 DIAGNOSIS — Z419 Encounter for procedure for purposes other than remedying health state, unspecified: Secondary | ICD-10-CM | POA: Diagnosis not present

## 2023-08-25 DIAGNOSIS — F4310 Post-traumatic stress disorder, unspecified: Secondary | ICD-10-CM | POA: Diagnosis not present

## 2023-09-01 ENCOUNTER — Ambulatory Visit: Payer: Medicaid Other | Admitting: Nurse Practitioner

## 2023-09-01 NOTE — Progress Notes (Deleted)
There were no vitals taken for this visit.   Subjective:    Patient ID: Julie Todd, female    DOB: 10/14/1991, 32 y.o.   MRN: [id number]  HPI: Julie Todd is a 32 y.o. female presenting on 09/01/2023 for comprehensive medical examination. Current medical complaints include:{Blank single:19197::"none","***"}  She currently lives with: Menopausal Symptoms: {Blank single:19197::"yes","no"}  ASTHMA Asthma status: {Blank single:19197::"controlled","uncontrolled","better","worse","exacerbated","stable"} Satisfied with current treatment?: {Blank single:19197::"yes","no"} Albuterol/rescue inhaler frequency:  Dyspnea frequency:  Wheezing frequency: Cough frequency:  Nocturnal symptom frequency:  Limitation of activity: {Blank single:19197::"yes","no"} Current upper respiratory symptoms: {Blank single:19197::"yes","no"} Triggers:  Home peak flows: Last Spirometry:  Failed/intolerant to following asthma meds:  Asthma meds in past:  Aerochamber/spacer use: {Blank single:19197::"yes","no"} Visits to ER or Urgent Care in past year: {Blank single:19197::"yes","no"} Pneumovax: {Blank single:19197::"Up to Date","Not up to Date","unknown"} Influenza: {Blank single:19197::"Up to Date","Not up to Date","unknown"}   Depression Screen done today and results listed below:     02/24/2022   10:58 AM 01/20/2021    3:00 PM  Depression screen PHQ 2/9  Decreased Interest 1 0  Down, Depressed, Hopeless 1 0  PHQ - 2 Score 2 0  Altered sleeping 2   Tired, decreased energy 1   Change in appetite 3   Feeling bad or failure about yourself  0   Trouble concentrating 1   Moving slowly or fidgety/restless 0   Suicidal thoughts 0   PHQ-9 Score 9   Difficult doing work/chores Not difficult at all     The patient {has/does not QVZD:63875} a history of falls. I {did/did not:19850} complete a risk assessment for falls. A plan of care for falls {was/was not:19852} documented.   Past Medical  History:  Past Medical History:  Diagnosis Date   Abnormal Pap smear of cervix     Surgical History:  Past Surgical History:  Procedure Laterality Date   TENDON TRANSPLANT      Medications:  Current Outpatient Medications on File Prior to Visit  Medication Sig   albuterol (VENTOLIN HFA) 108 (90 Base) MCG/ACT inhaler Inhale 2 puffs into the lungs every 6 (six) hours as needed for wheezing or shortness of breath.   montelukast (SINGULAIR) 10 MG tablet Take 1 tablet (10 mg total) by mouth at bedtime. (Patient not taking: Reported on 05/22/2022)   valsartan (DIOVAN) 40 MG tablet Take 1 tablet (40 mg total) by mouth daily.   No current facility-administered medications on file prior to visit.    Allergies:  No Known Allergies  Social History:  Social History   Socioeconomic History   Marital status: Unknown    Spouse name: Not on file   Number of children: Not on file   Years of education: Not on file   Highest education level: Not on file  Occupational History   Not on file  Tobacco Use   Smoking status: Never   Smokeless tobacco: Never  Vaping Use   Vaping status: Former  Substance and Sexual Activity   Alcohol use: Yes    Alcohol/week: 4.0 standard drinks of alcohol    Types: 4 Standard drinks or equivalent per week   Drug use: Not Currently   Sexual activity: Yes    Birth control/protection: None  Other Topics Concern   Not on file  Social History Narrative   Not on file   Social Determinants of Health   Financial Resource Strain: Not on file  Food Insecurity: Not on file  Transportation Needs: Not on file  Physical Activity: Not on  file  Stress: Not on file  Social Connections: Not on file  Intimate Partner Violence: Not on file   Social History   Tobacco Use  Smoking Status Never  Smokeless Tobacco Never   Social History   Substance and Sexual Activity  Alcohol Use Yes   Alcohol/week: 4.0 standard drinks of alcohol   Types: 4 Standard drinks or  equivalent per week    Family History:  Family History  Problem Relation Age of Onset   Lupus Maternal Grandmother     Past medical history, surgical history, medications, allergies, family history and social history reviewed with patient today and changes made to appropriate areas of the chart.   ROS All other ROS negative except what is listed above and in the HPI.      Objective:    There were no vitals taken for this visit.  Wt Readings from Last 3 Encounters:  05/22/22 195 lb 3.2 oz (88.5 kg)  02/24/22 201 lb 12.8 oz (91.5 kg)  02/20/21 226 lb 9.6 oz (102.8 kg)    Physical Exam  Results for orders placed or performed in visit on 02/24/22  Comp Met (CMET)  Result Value Ref Range   Glucose 70 70 - 99 mg/dL   BUN 6 6 - 20 mg/dL   Creatinine, Ser 1.61 0.57 - 1.00 mg/dL   eGFR 096 >04 VW/UJW/1.19   BUN/Creatinine Ratio 8 (L) 9 - 23   Sodium 144 134 - 144 mmol/L   Potassium 3.9 3.5 - 5.2 mmol/L   Chloride 105 96 - 106 mmol/L   CO2 21 20 - 29 mmol/L   Calcium 9.2 8.7 - 10.2 mg/dL   Total Protein 7.1 6.0 - 8.5 g/dL   Albumin 4.2 3.8 - 4.8 g/dL   Globulin, Total 2.9 1.5 - 4.5 g/dL   Albumin/Globulin Ratio 1.4 1.2 - 2.2   Bilirubin Total 0.5 0.0 - 1.2 mg/dL   Alkaline Phosphatase 100 44 - 121 IU/L   AST 25 0 - 40 IU/L   ALT 21 0 - 32 IU/L  Lipid Profile  Result Value Ref Range   Cholesterol, Total 133 100 - 199 mg/dL   Triglycerides 56 0 - 149 mg/dL   HDL 50 >14 mg/dL   VLDL Cholesterol Cal 12 5 - 40 mg/dL   LDL Chol Calc (NIH) 71 0 - 99 mg/dL   Chol/HDL Ratio 2.7 0.0 - 4.4 ratio      Assessment & Plan:   Problem List Items Addressed This Visit       Cardiovascular and Mediastinum   Primary hypertension     Respiratory   Asthma - Primary     Follow up plan: No follow-ups on file.   LABORATORY TESTING:  - Pap smear: {Blank single:19197::"pap done","not applicable","up to date","done elsewhere"}  IMMUNIZATIONS:   - Tdap: Tetanus vaccination status  reviewed: {tetanus status:315746}. - Influenza: {Blank single:19197::"Up to date","Administered today","Postponed to flu season","Refused","Given elsewhere"} - Pneumovax: {Blank single:19197::"Up to date","Administered today","Not applicable","Refused","Given elsewhere"} - Prevnar: {Blank single:19197::"Up to date","Administered today","Not applicable","Refused","Given elsewhere"} - COVID: {Blank single:19197::"Up to date","Administered today","Not applicable","Refused","Given elsewhere"} - HPV: {Blank single:19197::"Up to date","Administered today","Not applicable","Refused","Given elsewhere"} - Shingrix vaccine: {Blank single:19197::"Up to date","Administered today","Not applicable","Refused","Given elsewhere"}  SCREENING: -Mammogram: {Blank single:19197::"Up to date","Ordered today","Not applicable","Refused","Done elsewhere"}  - Colonoscopy: {Blank single:19197::"Up to date","Ordered today","Not applicable","Refused","Done elsewhere"}  - Bone Density: {Blank single:19197::"Up to date","Ordered today","Not applicable","Refused","Done elsewhere"}  -Hearing Test: {Blank single:19197::"Up to date","Ordered today","Not applicable","Refused","Done elsewhere"}  -Spirometry: {Blank single:19197::"Up to date","Ordered today","Not applicable","Refused","Done elsewhere"}  PATIENT COUNSELING:   Advised to take 1 mg of folate supplement per day if capable of pregnancy.   Sexuality: Discussed sexually transmitted diseases, partner selection, use of condoms, avoidance of unintended pregnancy  and contraceptive alternatives.   Advised to avoid cigarette smoking.  I discussed with the patient that most people either abstain from alcohol or drink within safe limits (<=14/week and <=4 drinks/occasion for males, <=7/weeks and <= 3 drinks/occasion for females) and that the risk for alcohol disorders and other health effects rises proportionally with the number of drinks per week and how often a drinker exceeds  daily limits.  Discussed cessation/primary prevention of drug use and availability of treatment for abuse.   Diet: Encouraged to adjust caloric intake to maintain  or achieve ideal body weight, to reduce intake of dietary saturated fat and total fat, to limit sodium intake by avoiding high sodium foods and not adding table salt, and to maintain adequate dietary potassium and calcium preferably from fresh fruits, vegetables, and low-fat dairy products.    stressed the importance of regular exercise  Injury prevention: Discussed safety belts, safety helmets, smoke detector, smoking near bedding or upholstery.   Dental health: Discussed importance of regular tooth brushing, flossing, and dental visits.    NEXT PREVENTATIVE PHYSICAL DUE IN 1 YEAR. No follow-ups on file.

## 2023-09-08 DIAGNOSIS — F4310 Post-traumatic stress disorder, unspecified: Secondary | ICD-10-CM | POA: Diagnosis not present

## 2023-09-19 DIAGNOSIS — Z419 Encounter for procedure for purposes other than remedying health state, unspecified: Secondary | ICD-10-CM | POA: Diagnosis not present

## 2023-09-22 DIAGNOSIS — F4310 Post-traumatic stress disorder, unspecified: Secondary | ICD-10-CM | POA: Diagnosis not present

## 2023-10-06 DIAGNOSIS — F4310 Post-traumatic stress disorder, unspecified: Secondary | ICD-10-CM | POA: Diagnosis not present

## 2023-10-20 DIAGNOSIS — Z419 Encounter for procedure for purposes other than remedying health state, unspecified: Secondary | ICD-10-CM | POA: Diagnosis not present

## 2023-10-25 DIAGNOSIS — F4310 Post-traumatic stress disorder, unspecified: Secondary | ICD-10-CM | POA: Diagnosis not present

## 2023-11-15 DIAGNOSIS — F4310 Post-traumatic stress disorder, unspecified: Secondary | ICD-10-CM | POA: Diagnosis not present

## 2023-11-20 DIAGNOSIS — Z419 Encounter for procedure for purposes other than remedying health state, unspecified: Secondary | ICD-10-CM | POA: Diagnosis not present

## 2023-12-18 DIAGNOSIS — Z419 Encounter for procedure for purposes other than remedying health state, unspecified: Secondary | ICD-10-CM | POA: Diagnosis not present

## 2024-01-29 DIAGNOSIS — Z419 Encounter for procedure for purposes other than remedying health state, unspecified: Secondary | ICD-10-CM | POA: Diagnosis not present

## 2024-02-01 DIAGNOSIS — F4310 Post-traumatic stress disorder, unspecified: Secondary | ICD-10-CM | POA: Diagnosis not present

## 2024-02-10 DIAGNOSIS — F4310 Post-traumatic stress disorder, unspecified: Secondary | ICD-10-CM | POA: Diagnosis not present

## 2024-02-28 DIAGNOSIS — Z419 Encounter for procedure for purposes other than remedying health state, unspecified: Secondary | ICD-10-CM | POA: Diagnosis not present

## 2024-03-01 DIAGNOSIS — F4310 Post-traumatic stress disorder, unspecified: Secondary | ICD-10-CM | POA: Diagnosis not present

## 2024-03-30 DIAGNOSIS — Z419 Encounter for procedure for purposes other than remedying health state, unspecified: Secondary | ICD-10-CM | POA: Diagnosis not present

## 2024-04-29 DIAGNOSIS — Z419 Encounter for procedure for purposes other than remedying health state, unspecified: Secondary | ICD-10-CM | POA: Diagnosis not present

## 2024-05-30 DIAGNOSIS — Z419 Encounter for procedure for purposes other than remedying health state, unspecified: Secondary | ICD-10-CM | POA: Diagnosis not present

## 2024-05-30 DIAGNOSIS — H5213 Myopia, bilateral: Secondary | ICD-10-CM | POA: Diagnosis not present

## 2024-06-22 ENCOUNTER — Encounter: Payer: Self-pay | Admitting: Nurse Practitioner

## 2024-06-22 ENCOUNTER — Ambulatory Visit (INDEPENDENT_AMBULATORY_CARE_PROVIDER_SITE_OTHER): Admitting: Nurse Practitioner

## 2024-06-22 VITALS — BP 114/79 | HR 82 | Temp 98.3°F | Ht 65.0 in | Wt 190.8 lb

## 2024-06-22 DIAGNOSIS — R630 Anorexia: Secondary | ICD-10-CM | POA: Diagnosis not present

## 2024-06-22 DIAGNOSIS — R1111 Vomiting without nausea: Secondary | ICD-10-CM

## 2024-06-22 DIAGNOSIS — F339 Major depressive disorder, recurrent, unspecified: Secondary | ICD-10-CM | POA: Insufficient documentation

## 2024-06-22 MED ORDER — FLUOXETINE HCL 10 MG PO TABS: 10.0000 mg | ORAL_TABLET | Freq: Every day | ORAL | 0 refills | Status: DC

## 2024-06-22 MED ORDER — OMEPRAZOLE 20 MG PO CPDR
20.0000 mg | DELAYED_RELEASE_CAPSULE | Freq: Every day | ORAL | 0 refills | Status: DC
Start: 2024-06-22 — End: 2024-07-31

## 2024-06-22 NOTE — Progress Notes (Signed)
 BP 114/79   Pulse 82   Temp 98.3 F (36.8 C) (Oral)   Ht 5' 5 (1.651 m)   Wt 190 lb 12.8 oz (86.5 kg)   LMP 06/12/2024 (Exact Date)   SpO2 98%   BMI 31.75 kg/m    Subjective:    Patient ID: Julie Todd, female    DOB: Sep 21, 1991, 33 y.o.   MRN: [id number]  HPI: Julie Todd is a 33 y.o. female  Chief Complaint  Patient presents with   Anorexia   Patient states she is having trouble eating which started about a year ago.  States some days she has an appetite and some days it is hard to keep food down.  She does throw up food.  She feels like this happens at least 3 days out of the week.  She is able to keep a meal or two down but at least one comes up.   Endorses symptoms of heart burn.  Feels her saliva feel really thick and becomes really nauseous.  Hasn't noticed significant weight loss. Compared to her last visit 2 years ago she has lost about 5lbs.  Feels like it happens with gluten and starchy foods.  Potatoes specifically she doesn't do well with.    DEPRESSION Patient states she has a lot of stuff going on.  She isn't sleeping.  Anxiety has been really bad.  Has racing thoughts. Difficulty settling down.  When she was younger she took medication but can't remember what it was.   Denies SI.        Relevant past medical, surgical, family and social history reviewed and updated as indicated. Interim medical history since our last visit reviewed. Allergies and medications reviewed and updated.  Review of Systems  Gastrointestinal:  Positive for abdominal pain, nausea and vomiting.  Psychiatric/Behavioral:  Positive for dysphoric mood. Negative for suicidal ideas. The patient is nervous/anxious.     Per HPI unless specifically indicated above     Objective:    BP 114/79   Pulse 82   Temp 98.3 F (36.8 C) (Oral)   Ht 5' 5 (1.651 m)   Wt 190 lb 12.8 oz (86.5 kg)   LMP 06/12/2024 (Exact Date)   SpO2 98%   BMI 31.75 kg/m   Wt Readings from Last 3  Encounters:  06/22/24 190 lb 12.8 oz (86.5 kg)  05/22/22 195 lb 3.2 oz (88.5 kg)  02/24/22 201 lb 12.8 oz (91.5 kg)    Physical Exam Vitals and nursing note reviewed.  Constitutional:      General: She is not in acute distress.    Appearance: Normal appearance. She is normal weight. She is not ill-appearing, toxic-appearing or diaphoretic.  HENT:     Head: Normocephalic.     Right Ear: External ear normal.     Left Ear: External ear normal.     Nose: Nose normal.     Mouth/Throat:     Mouth: Mucous membranes are moist.     Pharynx: Oropharynx is clear.  Eyes:     General:        Right eye: No discharge.        Left eye: No discharge.     Extraocular Movements: Extraocular movements intact.     Conjunctiva/sclera: Conjunctivae normal.     Pupils: Pupils are equal, round, and reactive to light.  Cardiovascular:     Rate and Rhythm: Normal rate and regular rhythm.     Heart sounds: No murmur heard. Pulmonary:  Effort: Pulmonary effort is normal. No respiratory distress.     Breath sounds: Normal breath sounds. No wheezing or rales.  Abdominal:     General: Abdomen is flat. Bowel sounds are normal.     Palpations: Abdomen is soft.  Musculoskeletal:     Cervical back: Normal range of motion and neck supple.  Skin:    General: Skin is warm and dry.     Capillary Refill: Capillary refill takes less than 2 seconds.  Neurological:     General: No focal deficit present.     Mental Status: She is alert and oriented to person, place, and time. Mental status is at baseline.  Psychiatric:        Mood and Affect: Mood normal.        Behavior: Behavior normal.        Thought Content: Thought content normal.        Judgment: Judgment normal.     Results for orders placed or performed in visit on 02/24/22  Comp Met (CMET)   Collection Time: 02/24/22 11:10 AM  Result Value Ref Range   Glucose 70 70 - 99 mg/dL   BUN 6 6 - 20 mg/dL   Creatinine, Ser 9.25 0.57 - 1.00 mg/dL    eGFR 890 >40 fO/fpw/8.26   BUN/Creatinine Ratio 8 (L) 9 - 23   Sodium 144 134 - 144 mmol/L   Potassium 3.9 3.5 - 5.2 mmol/L   Chloride 105 96 - 106 mmol/L   CO2 21 20 - 29 mmol/L   Calcium 9.2 8.7 - 10.2 mg/dL   Total Protein 7.1 6.0 - 8.5 g/dL   Albumin 4.2 3.8 - 4.8 g/dL   Globulin, Total 2.9 1.5 - 4.5 g/dL   Albumin/Globulin Ratio 1.4 1.2 - 2.2   Bilirubin Total 0.5 0.0 - 1.2 mg/dL   Alkaline Phosphatase 100 44 - 121 IU/L   AST 25 0 - 40 IU/L   ALT 21 0 - 32 IU/L  Lipid Profile   Collection Time: 02/24/22 11:10 AM  Result Value Ref Range   Cholesterol, Total 133 100 - 199 mg/dL   Triglycerides 56 0 - 149 mg/dL   HDL 50 >60 mg/dL   VLDL Cholesterol Cal 12 5 - 40 mg/dL   LDL Chol Calc (NIH) 71 0 - 99 mg/dL   Chol/HDL Ratio 2.7 0.0 - 4.4 ratio      Assessment & Plan:   Problem List Items Addressed This Visit       Other   Depression, recurrent (HCC) - Primary   Chronic.  Has struggled in the past but has been exacerbated at this time.  Will start Fluoxetine  10mg .  Okay to take two if tolerating well after two weeks.  Side effects and benefits of medication discussed during visit.  Follow up in 1 month.        Relevant Medications   FLUoxetine  (PROZAC ) 10 MG tablet   Other Visit Diagnoses       Loss of appetite       Ongoing x 1 year. Labs ordered. Recommend food journal to determine what foods cause symptoms. Will start Omeprazole  to see if symptoms improve. FU 1 month.   Relevant Orders   Celiac Disease Panel   Alpha-Gal Panel   Comp Met (CMET)     Vomiting without nausea, unspecified vomiting type       Ongoing x 1 year. Labs ordered. Recommend food journal to determine what foods cause symptoms. Will start Omeprazole  to  see if symptoms improve. FU 1 month.   Relevant Orders   Celiac Disease Panel   Alpha-Gal Panel   Comp Met (CMET)        Follow up plan: Return in about 1 month (around 07/22/2024) for Depression/Anxiety FU.

## 2024-06-22 NOTE — Progress Notes (Deleted)
 There were no vitals taken for this visit.   Subjective:    Patient ID: Julie Todd, female    DOB: 1990-12-18, 33 y.o.   MRN: [id number]  HPI: Julie Todd is a 33 y.o. female presenting on 06/22/2024 for comprehensive medical examination. Current medical complaints include:{Blank single:19197::none,***}  She currently lives with: Menopausal Symptoms: {Blank single:19197::yes,no}  HYPERTENSION {Blank single:19197::without,with} Chronic Kidney Disease Hypertension status: {Blank single:19197::controlled,uncontrolled,better,worse,exacerbated,stable}  Satisfied with current treatment? {Blank single:19197::yes,no} Duration of hypertension: {Blank single:19197::chronic,months,years} BP monitoring frequency:  {Blank single:19197::not checking,rarely,daily,weekly,monthly,a few times a day,a few times a week,a few times a month} BP range:  BP medication side effects:  {Blank single:19197::yes,no} Medication compliance: {Blank single:19197::excellent compliance,good compliance,fair compliance,poor compliance} Previous BP meds:{Blank multiple:19196::none,amlodipine,amlodipine/benazepril,atenolol,benazepril,benazepril/HCTZ,bisoprolol (bystolic),carvedilol,chlorthalidone,clonidine,diltiazem,exforge HCT,HCTZ,irbesartan (avapro),labetalol,lisinopril,lisinopril-HCTZ,losartan (cozaar),methyldopa,nifedipine,olmesartan (benicar),olmesartan-HCTZ,quinapril,ramipril,spironalactone,tekturna,valsartan ,valsartan -HCTZ,verapamil} Aspirin: {Blank single:19197::yes,no} Recurrent headaches: {Blank single:19197::yes,no} Visual changes: {Blank single:19197::yes,no} Palpitations: {Blank single:19197::yes,no} Dyspnea: {Blank single:19197::yes,no} Chest pain: {Blank single:19197::yes,no} Lower extremity edema: {Blank single:19197::yes,no} Dizzy/lightheaded: {Blank  single:19197::yes,no}   Depression Screen done today and results listed below:     02/24/2022   10:58 AM 01/20/2021    3:00 PM  Depression screen PHQ 2/9  Decreased Interest 1 0  Down, Depressed, Hopeless 1 0  PHQ - 2 Score 2 0  Altered sleeping 2   Tired, decreased energy 1   Change in appetite 3   Feeling bad or failure about yourself  0   Trouble concentrating 1   Moving slowly or fidgety/restless 0   Suicidal thoughts 0   PHQ-9 Score 9   Difficult doing work/chores Not difficult at all     The patient {has/does not yjcz:80150} a history of falls. I {did/did not:19850} complete a risk assessment for falls. A plan of care for falls {was/was not:19852} documented.   Past Medical History:  Past Medical History:  Diagnosis Date   Abnormal Pap smear of cervix     Surgical History:  Past Surgical History:  Procedure Laterality Date   TENDON TRANSPLANT      Medications:  Current Outpatient Medications on File Prior to Visit  Medication Sig   albuterol  (VENTOLIN  HFA) 108 (90 Base) MCG/ACT inhaler Inhale 2 puffs into the lungs every 6 (six) hours as needed for wheezing or shortness of breath.   montelukast  (SINGULAIR ) 10 MG tablet Take 1 tablet (10 mg total) by mouth at bedtime. (Patient not taking: Reported on 05/22/2022)   valsartan  (DIOVAN ) 40 MG tablet Take 1 tablet (40 mg total) by mouth daily.   No current facility-administered medications on file prior to visit.    Allergies:  No Known Allergies  Social History:  Social History   Socioeconomic History   Marital status: Unknown    Spouse name: Not on file   Number of children: Not on file   Years of education: Not on file   Highest education level: Not on file  Occupational History   Not on file  Tobacco Use   Smoking status: Never   Smokeless tobacco: Never  Vaping Use   Vaping status: Former  Substance and Sexual Activity   Alcohol use: Yes    Alcohol/week: 4.0 standard drinks of alcohol    Types:  4 Standard drinks or equivalent per week   Drug use: Not Currently   Sexual activity: Yes    Birth control/protection: None  Other Topics Concern   Not on file  Social History Narrative   Not on file   Social Drivers of Health   Financial Resource Strain: Not on file  Food Insecurity: Not on file  Transportation Needs: Not on file  Physical Activity: Not on file  Stress: Not on file  Social Connections: Not on file  Intimate Partner Violence: Not on file   Social History   Tobacco Use  Smoking Status Never  Smokeless Tobacco Never   Social History   Substance and Sexual Activity  Alcohol Use Yes   Alcohol/week: 4.0 standard drinks of alcohol   Types: 4 Standard drinks or equivalent per week    Family History:  Family History  Problem Relation Age of Onset   Lupus Maternal Grandmother     Past medical history, surgical history, medications, allergies, family history and social history reviewed with patient today and changes made to appropriate areas of the chart.   ROS All other ROS negative except what is listed above and in the HPI.      Objective:    There were no vitals taken for this visit.  Wt Readings from Last 3 Encounters:  05/22/22 195 lb 3.2 oz (88.5 kg)  02/24/22 201 lb 12.8 oz (91.5 kg)  02/20/21 226 lb 9.6 oz (102.8 kg)    Physical Exam  Results for orders placed or performed in visit on 02/24/22  Comp Met (CMET)   Collection Time: 02/24/22 11:10 AM  Result Value Ref Range   Glucose 70 70 - 99 mg/dL   BUN 6 6 - 20 mg/dL   Creatinine, Ser 9.25 0.57 - 1.00 mg/dL   eGFR 890 >40 fO/fpw/8.26   BUN/Creatinine Ratio 8 (L) 9 - 23   Sodium 144 134 - 144 mmol/L   Potassium 3.9 3.5 - 5.2 mmol/L   Chloride 105 96 - 106 mmol/L   CO2 21 20 - 29 mmol/L   Calcium 9.2 8.7 - 10.2 mg/dL   Total Protein 7.1 6.0 - 8.5 g/dL   Albumin 4.2 3.8 - 4.8 g/dL   Globulin, Total 2.9 1.5 - 4.5 g/dL   Albumin/Globulin Ratio 1.4 1.2 - 2.2   Bilirubin Total 0.5 0.0  - 1.2 mg/dL   Alkaline Phosphatase 100 44 - 121 IU/L   AST 25 0 - 40 IU/L   ALT 21 0 - 32 IU/L  Lipid Profile   Collection Time: 02/24/22 11:10 AM  Result Value Ref Range   Cholesterol, Total 133 100 - 199 mg/dL   Triglycerides 56 0 - 149 mg/dL   HDL 50 >60 mg/dL   VLDL Cholesterol Cal 12 5 - 40 mg/dL   LDL Chol Calc (NIH) 71 0 - 99 mg/dL   Chol/HDL Ratio 2.7 0.0 - 4.4 ratio      Assessment & Plan:   Problem List Items Addressed This Visit       Cardiovascular and Mediastinum   Primary hypertension - Primary     Follow up plan: No follow-ups on file.   LABORATORY TESTING:  - Pap smear: {Blank single:19197::pap done,not applicable,up to date,done elsewhere}  IMMUNIZATIONS:   - Tdap: Tetanus vaccination status reviewed: {tetanus status:315746}. - Influenza: {Blank single:19197::Up to date,Administered today,Postponed to flu season,Refused,Given elsewhere} - Pneumovax: {Blank single:19197::Up to date,Administered today,Not applicable,Refused,Given elsewhere} - Prevnar: {Blank single:19197::Up to date,Administered today,Not applicable,Refused,Given elsewhere} - COVID: {Blank single:19197::Up to date,Administered today,Not applicable,Refused,Given elsewhere} - HPV: {Blank single:19197::Up to date,Administered today,Not applicable,Refused,Given elsewhere} - Shingrix vaccine: {Blank single:19197::Up to date,Administered today,Not applicable,Refused,Given elsewhere}  SCREENING: -Mammogram: {Blank single:19197::Up to date,Ordered today,Not applicable,Refused,Done elsewhere}  - Colonoscopy: {Blank single:19197::Up to date,Ordered today,Not applicable,Refused,Done elsewhere}  - Bone Density: {Blank single:19197::Up to date,Ordered today,Not applicable,Refused,Done elsewhere}  -Hearing Test: {Blank single:19197::Up  to date,Ordered today,Not applicable,Refused,Done elsewhere}   -Spirometry: {Blank single:19197::Up to date,Ordered today,Not applicable,Refused,Done elsewhere}   PATIENT COUNSELING:   Advised to take 1 mg of folate supplement per day if capable of pregnancy.   Sexuality: Discussed sexually transmitted diseases, partner selection, use of condoms, avoidance of unintended pregnancy  and contraceptive alternatives.   Advised to avoid cigarette smoking.  I discussed with the patient that most people either abstain from alcohol or drink within safe limits (<=14/week and <=4 drinks/occasion for males, <=7/weeks and <= 3 drinks/occasion for females) and that the risk for alcohol disorders and other health effects rises proportionally with the number of drinks per week and how often a drinker exceeds daily limits.  Discussed cessation/primary prevention of drug use and availability of treatment for abuse.   Diet: Encouraged to adjust caloric intake to maintain  or achieve ideal body weight, to reduce intake of dietary saturated fat and total fat, to limit sodium intake by avoiding high sodium foods and not adding table salt, and to maintain adequate dietary potassium and calcium preferably from fresh fruits, vegetables, and low-fat dairy products.    stressed the importance of regular exercise  Injury prevention: Discussed safety belts, safety helmets, smoke detector, smoking near bedding or upholstery.   Dental health: Discussed importance of regular tooth brushing, flossing, and dental visits.    NEXT PREVENTATIVE PHYSICAL DUE IN 1 YEAR. No follow-ups on file.

## 2024-06-22 NOTE — Assessment & Plan Note (Signed)
 Chronic.  Has struggled in the past but has been exacerbated at this time.  Will start Fluoxetine  10mg .  Okay to take two if tolerating well after two weeks.  Side effects and benefits of medication discussed during visit.  Follow up in 1 month.

## 2024-06-29 ENCOUNTER — Telehealth: Payer: Self-pay

## 2024-06-29 ENCOUNTER — Other Ambulatory Visit (HOSPITAL_COMMUNITY): Payer: Self-pay

## 2024-06-29 MED ORDER — FLUOXETINE HCL 10 MG PO CAPS: 10.0000 mg | ORAL_CAPSULE | Freq: Every day | ORAL | 0 refills | Status: DC

## 2024-06-29 NOTE — Telephone Encounter (Signed)
 Capsules sent to the pharmacy.

## 2024-06-29 NOTE — Telephone Encounter (Signed)
 Pharmacy Patient Advocate Encounter   Received notification from Onbase that prior authorization for Fluoxetine  10mg  tablets is required/requested.   Insurance verification completed.   The patient is insured through [employer] .   Per test claim:  Fluoxetine  10mg  CAPSULE is preferred by the insurance.  If suggested medication is appropriate, Please send in a new RX and discontinue this one. If not, please advise as to why it's not appropriate so that we may request a Prior Authorization. Please note, some preferred medications may still require a PA.  If the suggested medications have not been trialed and there are no contraindications to their use, the PA will not be submitted, as it will not be approved.

## 2024-06-30 DIAGNOSIS — Z419 Encounter for procedure for purposes other than remedying health state, unspecified: Secondary | ICD-10-CM | POA: Diagnosis not present

## 2024-07-03 ENCOUNTER — Other Ambulatory Visit (HOSPITAL_COMMUNITY): Payer: Self-pay

## 2024-07-07 DIAGNOSIS — F4310 Post-traumatic stress disorder, unspecified: Secondary | ICD-10-CM | POA: Diagnosis not present

## 2024-07-07 DIAGNOSIS — Z833 Family history of diabetes mellitus: Secondary | ICD-10-CM | POA: Diagnosis not present

## 2024-07-07 DIAGNOSIS — K219 Gastro-esophageal reflux disease without esophagitis: Secondary | ICD-10-CM | POA: Diagnosis not present

## 2024-07-07 DIAGNOSIS — F419 Anxiety disorder, unspecified: Secondary | ICD-10-CM | POA: Diagnosis not present

## 2024-07-07 DIAGNOSIS — E669 Obesity, unspecified: Secondary | ICD-10-CM | POA: Diagnosis not present

## 2024-07-07 DIAGNOSIS — K08409 Partial loss of teeth, unspecified cause, unspecified class: Secondary | ICD-10-CM | POA: Diagnosis not present

## 2024-07-07 DIAGNOSIS — I10 Essential (primary) hypertension: Secondary | ICD-10-CM | POA: Diagnosis not present

## 2024-07-07 DIAGNOSIS — J45909 Unspecified asthma, uncomplicated: Secondary | ICD-10-CM | POA: Diagnosis not present

## 2024-07-07 DIAGNOSIS — F325 Major depressive disorder, single episode, in full remission: Secondary | ICD-10-CM | POA: Diagnosis not present

## 2024-07-07 DIAGNOSIS — Z87891 Personal history of nicotine dependence: Secondary | ICD-10-CM | POA: Diagnosis not present

## 2024-07-25 ENCOUNTER — Ambulatory Visit: Admitting: Nurse Practitioner

## 2024-07-26 ENCOUNTER — Ambulatory Visit: Admitting: Nurse Practitioner

## 2024-07-27 ENCOUNTER — Other Ambulatory Visit: Payer: Self-pay | Admitting: Nurse Practitioner

## 2024-07-30 DIAGNOSIS — Z419 Encounter for procedure for purposes other than remedying health state, unspecified: Secondary | ICD-10-CM | POA: Diagnosis not present

## 2024-07-31 NOTE — Telephone Encounter (Signed)
 Requested Prescriptions  Pending Prescriptions Disp Refills   omeprazole  (PRILOSEC) 20 MG capsule [Pharmacy Med Name: OMEPRAZOLE  DR 20 MG CAPSULE] 30 capsule 0    Sig: TAKE 1 CAPSULE BY MOUTH EVERY DAY     Gastroenterology: Proton Pump Inhibitors Passed - 07/31/2024  1:15 PM      Passed - Valid encounter within last 12 months    Recent Outpatient Visits           1 month ago Depression, recurrent   [facility] Melvin Pao, NP

## 2024-08-01 ENCOUNTER — Ambulatory Visit: Admitting: Nurse Practitioner

## 2024-08-01 NOTE — Progress Notes (Deleted)
 There were no vitals taken for this visit.   Subjective:    Patient ID: Julie Todd, female    DOB: 1991-01-11, 33 y.o.   MRN: [id number]  HPI: Julie Todd is a 33 y.o. female  No chief complaint on file.  Patient states she is having trouble eating which started about a year ago.  States some days she has an appetite and some days it is hard to keep food down.  She does throw up food.  She feels like this happens at least 3 days out of the week.  She is able to keep a meal or two down but at least one comes up.   Endorses symptoms of heart burn.  Feels her saliva feel really thick and becomes really nauseous.  Hasn't noticed significant weight loss. Compared to her last visit 2 years ago she has lost about 5lbs.  Feels like it happens with gluten and starchy foods.  Potatoes specifically she doesn't do well with.    DEPRESSION Patient states she has a lot of stuff going on.  She isn't sleeping.  Anxiety has been really bad.  Has racing thoughts. Difficulty settling down.  When she was younger she took medication but can't remember what it was.   Denies SI.        Relevant past medical, surgical, family and social history reviewed and updated as indicated. Interim medical history since our last visit reviewed. Allergies and medications reviewed and updated.  Review of Systems  Gastrointestinal:  Positive for abdominal pain, nausea and vomiting.  Psychiatric/Behavioral:  Positive for dysphoric mood. Negative for suicidal ideas. The patient is nervous/anxious.     Per HPI unless specifically indicated above     Objective:    There were no vitals taken for this visit.  Wt Readings from Last 3 Encounters:  06/22/24 190 lb 12.8 oz (86.5 kg)  05/22/22 195 lb 3.2 oz (88.5 kg)  02/24/22 201 lb 12.8 oz (91.5 kg)    Physical Exam Vitals and nursing note reviewed.  Constitutional:      General: She is not in acute distress.    Appearance: Normal appearance. She is normal  weight. She is not ill-appearing, toxic-appearing or diaphoretic.  HENT:     Head: Normocephalic.     Right Ear: External ear normal.     Left Ear: External ear normal.     Nose: Nose normal.     Mouth/Throat:     Mouth: Mucous membranes are moist.     Pharynx: Oropharynx is clear.  Eyes:     General:        Right eye: No discharge.        Left eye: No discharge.     Extraocular Movements: Extraocular movements intact.     Conjunctiva/sclera: Conjunctivae normal.     Pupils: Pupils are equal, round, and reactive to light.  Cardiovascular:     Rate and Rhythm: Normal rate and regular rhythm.     Heart sounds: No murmur heard. Pulmonary:     Effort: Pulmonary effort is normal. No respiratory distress.     Breath sounds: Normal breath sounds. No wheezing or rales.  Abdominal:     General: Abdomen is flat. Bowel sounds are normal.     Palpations: Abdomen is soft.  Musculoskeletal:     Cervical back: Normal range of motion and neck supple.  Skin:    General: Skin is warm and dry.     Capillary Refill: Capillary refill takes  less than 2 seconds.  Neurological:     General: No focal deficit present.     Mental Status: She is alert and oriented to person, place, and time. Mental status is at baseline.  Psychiatric:        Mood and Affect: Mood normal.        Behavior: Behavior normal.        Thought Content: Thought content normal.        Judgment: Judgment normal.     Results for orders placed or performed in visit on 02/24/22  Comp Met (CMET)   Collection Time: 02/24/22 11:10 AM  Result Value Ref Range   Glucose 70 70 - 99 mg/dL   BUN 6 6 - 20 mg/dL   Creatinine, Ser 9.25 0.57 - 1.00 mg/dL   eGFR 890 >40 fO/fpw/8.26   BUN/Creatinine Ratio 8 (L) 9 - 23   Sodium 144 134 - 144 mmol/L   Potassium 3.9 3.5 - 5.2 mmol/L   Chloride 105 96 - 106 mmol/L   CO2 21 20 - 29 mmol/L   Calcium 9.2 8.7 - 10.2 mg/dL   Total Protein 7.1 6.0 - 8.5 g/dL   Albumin 4.2 3.8 - 4.8 g/dL    Globulin, Total 2.9 1.5 - 4.5 g/dL   Albumin/Globulin Ratio 1.4 1.2 - 2.2   Bilirubin Total 0.5 0.0 - 1.2 mg/dL   Alkaline Phosphatase 100 44 - 121 IU/L   AST 25 0 - 40 IU/L   ALT 21 0 - 32 IU/L  Lipid Profile   Collection Time: 02/24/22 11:10 AM  Result Value Ref Range   Cholesterol, Total 133 100 - 199 mg/dL   Triglycerides 56 0 - 149 mg/dL   HDL 50 >60 mg/dL   VLDL Cholesterol Cal 12 5 - 40 mg/dL   LDL Chol Calc (NIH) 71 0 - 99 mg/dL   Chol/HDL Ratio 2.7 0.0 - 4.4 ratio      Assessment & Plan:   Problem List Items Addressed This Visit   None     Follow up plan: No follow-ups on file.

## 2024-08-14 DIAGNOSIS — F4320 Adjustment disorder, unspecified: Secondary | ICD-10-CM | POA: Diagnosis not present

## 2024-08-21 ENCOUNTER — Ambulatory Visit (INDEPENDENT_AMBULATORY_CARE_PROVIDER_SITE_OTHER): Admitting: Nurse Practitioner

## 2024-08-21 ENCOUNTER — Encounter: Payer: Self-pay | Admitting: Nurse Practitioner

## 2024-08-21 VITALS — BP 128/68 | HR 71 | Temp 98.2°F | Ht 65.0 in | Wt 194.8 lb

## 2024-08-21 DIAGNOSIS — F339 Major depressive disorder, recurrent, unspecified: Secondary | ICD-10-CM | POA: Diagnosis not present

## 2024-08-21 MED ORDER — FLUOXETINE HCL 10 MG PO CAPS: 10.0000 mg | ORAL_CAPSULE | Freq: Every day | ORAL | 0 refills | Status: AC

## 2024-08-21 MED ORDER — AIRSUPRA 90-80 MCG/ACT IN AERO: 10.7000 g | INHALATION_SPRAY | RESPIRATORY_TRACT | 1 refills | Status: DC | PRN

## 2024-08-21 NOTE — Progress Notes (Signed)
 BP 128/68   Pulse 71   Temp 98.2 F (36.8 C) (Oral)   Ht 5' 5 (1.651 m)   Wt 194 lb 12.8 oz (88.4 kg)   LMP  (LMP Unknown)   SpO2 97%   BMI 32.42 kg/m    Subjective:    Patient ID: Julie Todd, female    DOB: 1990-11-10, 33 y.o.   MRN: [id number]  HPI: Julie Todd is a 33 y.o. female  Chief Complaint  Patient presents with   Anxiety    Patient states she feels like the Fluoxetine  was helping, but has been out of medication for the last 3 weeks    Depression    DEPRESSION Patient states she felt like the fluoxetine  was helping but not quite enough.  States she feels like her racing thoughts and sleeping have been getting better.  Feels like her anxiety is doing a lot better now.    Denies SI.      08/21/2024    2:50 PM 06/22/2024    9:11 AM 02/24/2022   10:58 AM  PHQ9 SCORE ONLY  PHQ-9 Total Score 2 15 9       Data saved with a previous flowsheet row definition      08/21/2024    2:51 PM 06/22/2024    9:12 AM 02/24/2022   10:58 AM  GAD 7 : Generalized Anxiety Score  Nervous, Anxious, on Edge 0 1 1  Control/stop worrying 0 1 0  Worry too much - different things 0 2 1  Trouble relaxing 0 0 0  Restless 0 1 1  Easily annoyed or irritable 0 1 0  Afraid - awful might happen 0 0 0  Total GAD 7 Score 0 6 3  Anxiety Difficulty Not difficult at all Very difficult Not difficult at all      Relevant past medical, surgical, family and social history reviewed and updated as indicated. Interim medical history since our last visit reviewed. Allergies and medications reviewed and updated.  Review of Systems  Psychiatric/Behavioral:  Positive for dysphoric mood. Negative for suicidal ideas. The patient is nervous/anxious.     Per HPI unless specifically indicated above     Objective:    BP 128/68   Pulse 71   Temp 98.2 F (36.8 C) (Oral)   Ht 5' 5 (1.651 m)   Wt 194 lb 12.8 oz (88.4 kg)   LMP  (LMP Unknown)   SpO2 97%   BMI 32.42 kg/m   Wt Readings from  Last 3 Encounters:  08/21/24 194 lb 12.8 oz (88.4 kg)  06/22/24 190 lb 12.8 oz (86.5 kg)  05/22/22 195 lb 3.2 oz (88.5 kg)    Physical Exam Vitals and nursing note reviewed.  Constitutional:      General: She is not in acute distress.    Appearance: Normal appearance. She is normal weight. She is not ill-appearing, toxic-appearing or diaphoretic.  HENT:     Head: Normocephalic.     Right Ear: External ear normal.     Left Ear: External ear normal.     Nose: Nose normal.     Mouth/Throat:     Mouth: Mucous membranes are moist.     Pharynx: Oropharynx is clear.  Eyes:     General:        Right eye: No discharge.        Left eye: No discharge.     Extraocular Movements: Extraocular movements intact.     Conjunctiva/sclera: Conjunctivae normal.  Pupils: Pupils are equal, round, and reactive to light.  Cardiovascular:     Rate and Rhythm: Normal rate and regular rhythm.     Heart sounds: No murmur heard. Pulmonary:     Effort: Pulmonary effort is normal. No respiratory distress.     Breath sounds: Normal breath sounds. No wheezing or rales.  Abdominal:     General: Abdomen is flat. Bowel sounds are normal.     Palpations: Abdomen is soft.  Musculoskeletal:     Cervical back: Normal range of motion and neck supple.  Skin:    General: Skin is warm and dry.     Capillary Refill: Capillary refill takes less than 2 seconds.  Neurological:     General: No focal deficit present.     Mental Status: She is alert and oriented to person, place, and time. Mental status is at baseline.  Psychiatric:        Mood and Affect: Mood normal.        Behavior: Behavior normal.        Thought Content: Thought content normal.        Judgment: Judgment normal.     Results for orders placed or performed in visit on 02/24/22  Comp Met (CMET)   Collection Time: 02/24/22 11:10 AM  Result Value Ref Range   Glucose 70 70 - 99 mg/dL   BUN 6 6 - 20 mg/dL   Creatinine, Ser 9.25 0.57 - 1.00 mg/dL    eGFR 890 >40 fO/fpw/8.26   BUN/Creatinine Ratio 8 (L) 9 - 23   Sodium 144 134 - 144 mmol/L   Potassium 3.9 3.5 - 5.2 mmol/L   Chloride 105 96 - 106 mmol/L   CO2 21 20 - 29 mmol/L   Calcium 9.2 8.7 - 10.2 mg/dL   Total Protein 7.1 6.0 - 8.5 g/dL   Albumin 4.2 3.8 - 4.8 g/dL   Globulin, Total 2.9 1.5 - 4.5 g/dL   Albumin/Globulin Ratio 1.4 1.2 - 2.2   Bilirubin Total 0.5 0.0 - 1.2 mg/dL   Alkaline Phosphatase 100 44 - 121 IU/L   AST 25 0 - 40 IU/L   ALT 21 0 - 32 IU/L  Lipid Profile   Collection Time: 02/24/22 11:10 AM  Result Value Ref Range   Cholesterol, Total 133 100 - 199 mg/dL   Triglycerides 56 0 - 149 mg/dL   HDL 50 >60 mg/dL   VLDL Cholesterol Cal 12 5 - 40 mg/dL   LDL Chol Calc (NIH) 71 0 - 99 mg/dL   Chol/HDL Ratio 2.7 0.0 - 4.4 ratio      Assessment & Plan:   Problem List Items Addressed This Visit       Other   Depression, recurrent - Primary   Chronic. Not well controlled. Will restart Fluoxetine  10mg  daily.  After two weeks, can increase to 20mg  if tolerating well.  Follow up in 2 months.  Call sooner if concerns rise.      Relevant Medications   FLUoxetine  (PROZAC ) 10 MG capsule      Follow up plan: Return in about 2 months (around 10/21/2024) for Depression/Anxiety FU.

## 2024-08-21 NOTE — Assessment & Plan Note (Signed)
 Chronic. Not well controlled. Will restart Fluoxetine  10mg  daily.  After two weeks, can increase to 20mg  if tolerating well.  Follow up in 2 months.  Call sooner if concerns rise.

## 2024-08-24 ENCOUNTER — Telehealth: Payer: Self-pay | Admitting: Pharmacy Technician

## 2024-08-24 ENCOUNTER — Other Ambulatory Visit (HOSPITAL_COMMUNITY): Payer: Self-pay

## 2024-08-24 NOTE — Telephone Encounter (Signed)
 Pharmacy Patient Advocate Encounter   Received notification from Onbase that prior authorization for Airsupra 90-80MCG/ACT aerosol is required/requested.   Insurance verification completed.   The patient is insured through [facility] [employer]ICA[employer].   Per test claim:  Below is a list of what is preferred by the insurance.  If suggested medication is appropriate, Please send in a new RX and discontinue this one. If not, please advise as to why it's not appropriate so that we may request a Prior Authorization. Please note, some preferred medications may still require a PA.  If the suggested medications have not been trialed and there are no contraindications to their use, the PA will not be submitted, as it will not be approved.

## 2024-08-24 NOTE — Telephone Encounter (Signed)
 This is a rescue inhaler like albuterol  not a maintenance inhaler like what is listed.

## 2024-08-25 ENCOUNTER — Other Ambulatory Visit (HOSPITAL_COMMUNITY): Payer: Self-pay

## 2024-08-28 ENCOUNTER — Other Ambulatory Visit (HOSPITAL_COMMUNITY): Payer: Self-pay

## 2024-08-28 MED ORDER — ALBUTEROL SULFATE HFA 108 (90 BASE) MCG/ACT IN AERS: 2.0000 | INHALATION_SPRAY | Freq: Four times a day (QID) | RESPIRATORY_TRACT | 2 refills | Status: AC | PRN

## 2024-08-28 NOTE — Telephone Encounter (Signed)
 Unfortunately her ins doesn't cover this medication. She could take the albuterol  and Budesonide separately, with budesonide being in the form of the Pulmicort flexhaler. That has a $4 copay. I'm pretty sure the Albuterol  inhaler is $4 as well. Unfortunately this gives her 2 copays, but I don't know that we'd get the valaria covered without her failing the other preferred medications Kiana sent previously. It may be that her insurance will only cover it as a preventative, rather than a rescue inhaler. Medicaid lists it as a non-preferred Inhaled corticosteroid combination.

## 2024-08-28 NOTE — Addendum Note (Signed)
 Addended by: MELVIN PAO on: 08/28/2024 01:56 PM   Modules accepted: Orders

## 2024-08-28 NOTE — Telephone Encounter (Signed)
 Albuterol  sent to the pharmacy for patient.

## 2024-10-23 ENCOUNTER — Ambulatory Visit: Payer: Self-pay | Admitting: Nurse Practitioner

## 2024-10-23 NOTE — Progress Notes (Deleted)
 "  There were no vitals taken for this visit.   Subjective:    Patient ID: Julie Todd, female    DOB: 25-Sep-1991, 34 y.o.   MRN: [id number]  HPI: Julie Todd is a 34 y.o. female  No chief complaint on file.   DEPRESSION Patient states she felt like the fluoxetine  was helping but not quite enough.  States she feels like her racing thoughts and sleeping have been getting better.  Feels like her anxiety is doing a lot better now.    Denies SI.      08/21/2024    2:50 PM 06/22/2024    9:11 AM 02/24/2022   10:58 AM  PHQ9 SCORE ONLY  PHQ-9 Total Score 2 15 9       Data saved with a previous flowsheet row definition      08/21/2024    2:51 PM 06/22/2024    9:12 AM 02/24/2022   10:58 AM  GAD 7 : Generalized Anxiety Score  Nervous, Anxious, on Edge 0 1 1  Control/stop worrying 0 1 0  Worry too much - different things 0 2 1  Trouble relaxing 0 0 0  Restless 0 1 1  Easily annoyed or irritable 0 1 0  Afraid - awful might happen 0 0 0  Total GAD 7 Score 0 6 3  Anxiety Difficulty Not difficult at all Very difficult Not difficult at all      Relevant past medical, surgical, family and social history reviewed and updated as indicated. Interim medical history since our last visit reviewed. Allergies and medications reviewed and updated.  Review of Systems  Psychiatric/Behavioral:  Positive for dysphoric mood. Negative for suicidal ideas. The patient is nervous/anxious.     Per HPI unless specifically indicated above     Objective:    There were no vitals taken for this visit.  Wt Readings from Last 3 Encounters:  08/21/24 194 lb 12.8 oz (88.4 kg)  06/22/24 190 lb 12.8 oz (86.5 kg)  05/22/22 195 lb 3.2 oz (88.5 kg)    Physical Exam Vitals and nursing note reviewed.  Constitutional:      General: She is not in acute distress.    Appearance: Normal appearance. She is normal weight. She is not ill-appearing, toxic-appearing or diaphoretic.  HENT:     Head: Normocephalic.      Right Ear: External ear normal.     Left Ear: External ear normal.     Nose: Nose normal.     Mouth/Throat:     Mouth: Mucous membranes are moist.     Pharynx: Oropharynx is clear.  Eyes:     General:        Right eye: No discharge.        Left eye: No discharge.     Extraocular Movements: Extraocular movements intact.     Conjunctiva/sclera: Conjunctivae normal.     Pupils: Pupils are equal, round, and reactive to light.  Cardiovascular:     Rate and Rhythm: Normal rate and regular rhythm.     Heart sounds: No murmur heard. Pulmonary:     Effort: Pulmonary effort is normal. No respiratory distress.     Breath sounds: Normal breath sounds. No wheezing or rales.  Abdominal:     General: Abdomen is flat. Bowel sounds are normal.     Palpations: Abdomen is soft.  Musculoskeletal:     Cervical back: Normal range of motion and neck supple.  Skin:    General: Skin is warm and  dry.     Capillary Refill: Capillary refill takes less than 2 seconds.  Neurological:     General: No focal deficit present.     Mental Status: She is alert and oriented to person, place, and time. Mental status is at baseline.  Psychiatric:        Mood and Affect: Mood normal.        Behavior: Behavior normal.        Thought Content: Thought content normal.        Judgment: Judgment normal.     Results for orders placed or performed in visit on 02/24/22  Comp Met (CMET)   Collection Time: 02/24/22 11:10 AM  Result Value Ref Range   Glucose 70 70 - 99 mg/dL   BUN 6 6 - 20 mg/dL   Creatinine, Ser 9.25 0.57 - 1.00 mg/dL   eGFR 890 >40 fO/fpw/8.26   BUN/Creatinine Ratio 8 (L) 9 - 23   Sodium 144 134 - 144 mmol/L   Potassium 3.9 3.5 - 5.2 mmol/L   Chloride 105 96 - 106 mmol/L   CO2 21 20 - 29 mmol/L   Calcium 9.2 8.7 - 10.2 mg/dL   Total Protein 7.1 6.0 - 8.5 g/dL   Albumin 4.2 3.8 - 4.8 g/dL   Globulin, Total 2.9 1.5 - 4.5 g/dL   Albumin/Globulin Ratio 1.4 1.2 - 2.2   Bilirubin Total 0.5 0.0 -  1.2 mg/dL   Alkaline Phosphatase 100 44 - 121 IU/L   AST 25 0 - 40 IU/L   ALT 21 0 - 32 IU/L  Lipid Profile   Collection Time: 02/24/22 11:10 AM  Result Value Ref Range   Cholesterol, Total 133 100 - 199 mg/dL   Triglycerides 56 0 - 149 mg/dL   HDL 50 >60 mg/dL   VLDL Cholesterol Cal 12 5 - 40 mg/dL   LDL Chol Calc (NIH) 71 0 - 99 mg/dL   Chol/HDL Ratio 2.7 0.0 - 4.4 ratio      Assessment & Plan:   Problem List Items Addressed This Visit   None      Follow up plan: No follow-ups on file.       "
